# Patient Record
Sex: Female | Born: 1960 | Race: White | Hispanic: No | Marital: Married | Smoking: Former smoker
Health system: Southern US, Community
[De-identification: ages and names within clinical notes are randomized; demographics above are authoritative.]

## PROBLEM LIST (undated history)

## (undated) DIAGNOSIS — F419 Anxiety disorder, unspecified: Secondary | ICD-10-CM

## (undated) DIAGNOSIS — M1990 Unspecified osteoarthritis, unspecified site: Secondary | ICD-10-CM

## (undated) DIAGNOSIS — J3081 Allergic rhinitis due to animal (cat) (dog) hair and dander: Secondary | ICD-10-CM

## (undated) DIAGNOSIS — I739 Peripheral vascular disease, unspecified: Secondary | ICD-10-CM

## (undated) HISTORY — PX: TUBAL LIGATION: SHX77

---

## 1981-05-25 HISTORY — PX: OTHER SURGICAL HISTORY: SHX169

## 1998-06-01 ENCOUNTER — Inpatient Hospital Stay (HOSPITAL_COMMUNITY): Admission: AD | Admit: 1998-06-01 | Discharge: 1998-06-01 | Payer: Self-pay | Admitting: Obstetrics & Gynecology

## 2000-11-11 ENCOUNTER — Other Ambulatory Visit: Admission: RE | Admit: 2000-11-11 | Discharge: 2000-11-11 | Payer: Self-pay | Admitting: Obstetrics & Gynecology

## 2000-11-17 ENCOUNTER — Encounter: Admission: RE | Admit: 2000-11-17 | Discharge: 2000-11-17 | Payer: Self-pay | Admitting: Obstetrics & Gynecology

## 2000-11-17 ENCOUNTER — Encounter: Payer: Self-pay | Admitting: Obstetrics & Gynecology

## 2010-07-12 ENCOUNTER — Inpatient Hospital Stay (INDEPENDENT_AMBULATORY_CARE_PROVIDER_SITE_OTHER)
Admission: RE | Admit: 2010-07-12 | Discharge: 2010-07-12 | Disposition: A | Discharge status: Home / Self Care | Payer: 59 | Source: Ambulatory Visit | Attending: Family Medicine | Admitting: Family Medicine

## 2010-07-12 ENCOUNTER — Emergency Department (HOSPITAL_COMMUNITY)
Admission: EM | Admit: 2010-07-12 | Discharge: 2010-07-12 | Disposition: A | Discharge status: Home / Self Care | Payer: 59 | Attending: Emergency Medicine | Admitting: Emergency Medicine

## 2010-07-12 DIAGNOSIS — R109 Unspecified abdominal pain: Secondary | ICD-10-CM

## 2010-07-12 DIAGNOSIS — K625 Hemorrhage of anus and rectum: Secondary | ICD-10-CM

## 2010-07-12 DIAGNOSIS — N760 Acute vaginitis: Secondary | ICD-10-CM | POA: Insufficient documentation

## 2010-07-12 DIAGNOSIS — B9689 Other specified bacterial agents as the cause of diseases classified elsewhere: Secondary | ICD-10-CM | POA: Insufficient documentation

## 2010-07-12 DIAGNOSIS — N949 Unspecified condition associated with female genital organs and menstrual cycle: Secondary | ICD-10-CM | POA: Insufficient documentation

## 2010-07-12 DIAGNOSIS — R197 Diarrhea, unspecified: Secondary | ICD-10-CM | POA: Insufficient documentation

## 2010-07-12 DIAGNOSIS — A499 Bacterial infection, unspecified: Secondary | ICD-10-CM | POA: Insufficient documentation

## 2010-07-12 LAB — URINALYSIS, ROUTINE W REFLEX MICROSCOPIC
Bilirubin Urine: NEGATIVE
Ketones, ur: NEGATIVE mg/dL
Leukocytes, UA: NEGATIVE
Nitrite: NEGATIVE
Protein, ur: NEGATIVE mg/dL

## 2010-07-12 LAB — DIFFERENTIAL
Basophils Absolute: 0 10*3/uL (ref 0.0–0.1)
Basophils Relative: 0 % (ref 0–1)
Eosinophils Absolute: 0.1 10*3/uL (ref 0.0–0.7)
Eosinophils Relative: 1 % (ref 0–5)
Lymphocytes Relative: 32 % (ref 12–46)
Lymphs Abs: 2.9 10*3/uL (ref 0.7–4.0)
Monocytes Absolute: 0.8 10*3/uL (ref 0.1–1.0)
Monocytes Relative: 9 % (ref 3–12)
Neutro Abs: 5.2 10*3/uL (ref 1.7–7.7)
Neutrophils Relative %: 57 % (ref 43–77)

## 2010-07-12 LAB — CBC
HCT: 39.4 % (ref 36.0–46.0)
Hemoglobin: 13.7 g/dL (ref 12.0–15.0)
MCH: 30.8 pg (ref 26.0–34.0)
MCHC: 34.8 g/dL (ref 30.0–36.0)
MCV: 88.5 fL (ref 78.0–100.0)
Platelets: 218 10*3/uL (ref 150–400)
RBC: 4.45 MIL/uL (ref 3.87–5.11)
RDW: 14.1 % (ref 11.5–15.5)
WBC: 9.1 10*3/uL (ref 4.0–10.5)

## 2010-07-12 LAB — COMPREHENSIVE METABOLIC PANEL
ALT: 25 U/L (ref 0–35)
AST: 24 U/L (ref 0–37)
Albumin: 4.1 g/dL (ref 3.5–5.2)
Alkaline Phosphatase: 67 U/L (ref 39–117)
BUN: 9 mg/dL (ref 6–23)
CO2: 29 mEq/L (ref 19–32)
Calcium: 9.8 mg/dL (ref 8.4–10.5)
Chloride: 105 mEq/L (ref 96–112)
Creatinine, Ser: 0.74 mg/dL (ref 0.4–1.2)
GFR calc Af Amer: >60 mL/min (ref >60)
GFR calc non Af Amer: >60 mL/min (ref >60)
Glucose, Bld: 96 mg/dL (ref 70–99)
Potassium: 4.5 mEq/L (ref 3.5–5.1)
Sodium: 142 mEq/L (ref 135–145)
Total Bilirubin: 0.6 mg/dL (ref 0.3–1.2)
Total Protein: 7.7 g/dL (ref 6.0–8.3)

## 2010-07-12 LAB — WET PREP, GENITAL
Trich, Wet Prep: NONE SEEN
Yeast Wet Prep HPF POC: NONE SEEN

## 2010-07-12 LAB — HEMOCCULT GUIAC POC 1CARD (OFFICE): Fecal Occult Bld: POSITIVE

## 2010-07-12 LAB — OCCULT BLOOD, POC DEVICE: Fecal Occult Bld: POSITIVE

## 2010-07-13 LAB — URINE CULTURE
Colony Count: NO GROWTH
Culture: NO GROWTH

## 2010-07-14 LAB — GC/CHLAMYDIA PROBE AMP, GENITAL: Chlamydia, DNA Probe: NEGATIVE

## 2012-10-06 ENCOUNTER — Encounter (HOSPITAL_COMMUNITY): Payer: Self-pay | Admitting: Obstetrics and Gynecology

## 2012-10-06 ENCOUNTER — Other Ambulatory Visit: Payer: Self-pay | Admitting: Internal Medicine

## 2012-10-06 DIAGNOSIS — R1011 Right upper quadrant pain: Secondary | ICD-10-CM

## 2012-10-07 ENCOUNTER — Other Ambulatory Visit: Payer: Self-pay | Admitting: Obstetrics and Gynecology

## 2012-10-07 ENCOUNTER — Ambulatory Visit
Admission: RE | Admit: 2012-10-07 | Discharge: 2012-10-07 | Disposition: A | Discharge status: Home / Self Care | Payer: 59 | Source: Ambulatory Visit | Attending: Internal Medicine | Admitting: Internal Medicine

## 2012-10-07 DIAGNOSIS — R1011 Right upper quadrant pain: Secondary | ICD-10-CM

## 2012-10-10 ENCOUNTER — Encounter (HOSPITAL_COMMUNITY): Payer: Self-pay | Admitting: Pharmacist

## 2012-10-10 ENCOUNTER — Other Ambulatory Visit: Payer: 59

## 2012-10-10 NOTE — H&P (Signed)
[name] is an 52 y.o. female who presented with vaginal bleeding and underwent an endometrial biopsy which was suggestive of endometrial carcinoma. Now being taking to operating room to undergo D&C and hysteroscopy to confirm diagnosis and assess extent of disease.   Past Medical History  Diagnosis Date  . SVD (spontaneous vaginal delivery) 1988  . Anxiety   . Allergy to cats   . Arthritis     hands    Past Surgical History  Procedure Laterality Date  . Cesarean section  1981  . Tubal ligation      History reviewed. No pertinent family history.  Social History:  reports that she quit smoking about 15 months ago. Her smoking use included Cigarettes. She has a 34 pack-year smoking history. She has never used smokeless tobacco. She reports that  drinks alcohol. She reports that she does not use illicit drugs.  Allergies: No Known Allergies  No prescriptions prior to admission    Review of Systems  Constitutional: Negative.   HENT: Negative.   Respiratory: Negative for cough and hemoptysis.   Cardiovascular: Negative for chest pain and palpitations.  Gastrointestinal: Negative for heartburn, nausea, vomiting, abdominal pain, diarrhea, constipation and blood in stool.  Genitourinary: Negative for dysuria, urgency, frequency and hematuria.    Height 5' 8.5" (1.74 m), weight 83.915 kg (185 lb). Physical Exam  Constitutional: She appears well-developed and well-nourished.  HENT:  Head: Normocephalic and atraumatic.  Eyes: Conjunctivae are normal. Pupils are equal, round, and reactive to light.  Neck: Normal range of motion. Neck supple. No thyromegaly present.  Cardiovascular: Normal rate, regular rhythm and normal heart sounds.   Respiratory: Effort normal and breath sounds normal.  GI: Soft. Bowel sounds are normal. She exhibits no distension and no mass. There is no tenderness. There is no rebound and no guarding.  Gyn Exam:    External genitalia wnl   BUS: within  normal limits   Vagina: without lesion   Uterus: Normal size anterior and non tender   Adnexa: Non tender and without mass    No results found for this or any previous visit (from the past 24 hour(s)).  No results found.  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  Kaelyn Innocent 10/10/2012, 4:20 PM

## 2012-10-11 ENCOUNTER — Ambulatory Visit (HOSPITAL_COMMUNITY): Payer: 59 | Admitting: Anesthesiology

## 2012-10-11 ENCOUNTER — Encounter (HOSPITAL_COMMUNITY): Payer: Self-pay | Admitting: Anesthesiology

## 2012-10-11 ENCOUNTER — Encounter (HOSPITAL_COMMUNITY)
Admission: RE | Disposition: A | Discharge status: Home / Self Care | Payer: Self-pay | Source: Ambulatory Visit | Attending: Obstetrics and Gynecology

## 2012-10-11 ENCOUNTER — Ambulatory Visit (HOSPITAL_COMMUNITY)
Admission: RE | Admit: 2012-10-11 | Discharge: 2012-10-11 | Disposition: A | Discharge status: Home / Self Care | Payer: 59 | Source: Ambulatory Visit | Attending: Obstetrics and Gynecology | Admitting: Obstetrics and Gynecology

## 2012-10-11 DIAGNOSIS — C549 Malignant neoplasm of corpus uteri, unspecified: Secondary | ICD-10-CM | POA: Insufficient documentation

## 2012-10-11 DIAGNOSIS — N950 Postmenopausal bleeding: Secondary | ICD-10-CM | POA: Insufficient documentation

## 2012-10-11 HISTORY — PX: HYSTEROSCOPY WITH D & C: SHX1775

## 2012-10-11 HISTORY — DX: Allergic rhinitis due to animal (cat) (dog) hair and dander: J30.81

## 2012-10-11 HISTORY — DX: Anxiety disorder, unspecified: F41.9

## 2012-10-11 HISTORY — DX: Unspecified osteoarthritis, unspecified site: M19.90

## 2012-10-11 LAB — CBC
MCHC: 32.5 g/dL (ref 30.0–36.0)
MCV: 86.8 fL (ref 78.0–100.0)
Platelets: 205 10*3/uL (ref 150–400)
RDW: 14.3 % (ref 11.5–15.5)
WBC: 6.8 10*3/uL (ref 4.0–10.5)

## 2012-10-11 LAB — BASIC METABOLIC PANEL
BUN: 13 mg/dL (ref 6–23)
Chloride: 103 mEq/L (ref 96–112)
Creatinine, Ser: 0.71 mg/dL (ref 0.50–1.10)
GFR calc Af Amer: >90 mL/min (ref >90)
GFR calc non Af Amer: >90 mL/min (ref >90)
Potassium: 4 mEq/L (ref 3.5–5.1)

## 2012-10-11 SURGERY — DILATATION AND CURETTAGE /HYSTEROSCOPY
Anesthesia: General | Site: Vagina | Wound class: Clean Contaminated

## 2012-10-11 MED ORDER — ONDANSETRON HCL 4 MG/2ML IJ SOLN
INTRAMUSCULAR | Status: AC
  Filled 2012-10-11: qty 2

## 2012-10-11 MED ORDER — LIDOCAINE HCL (CARDIAC) 20 MG/ML IV SOLN
INTRAVENOUS | Status: AC
  Filled 2012-10-11: qty 5

## 2012-10-11 MED ORDER — PROPOFOL 10 MG/ML IV EMUL
INTRAVENOUS | Status: AC
  Filled 2012-10-11: qty 20

## 2012-10-11 MED ORDER — LIDOCAINE HCL 1 % IJ SOLN
INTRAMUSCULAR | Status: DC | PRN
  Administered 2012-10-11: 10 mL

## 2012-10-11 MED ORDER — PROPOFOL 10 MG/ML IV BOLUS
INTRAVENOUS | Status: DC | PRN
  Administered 2012-10-11: 180 ug via INTRAVENOUS

## 2012-10-11 MED ORDER — LACTATED RINGERS IV SOLN
INTRAVENOUS | Status: DC
  Administered 2012-10-11: 08:00:00 via INTRAVENOUS

## 2012-10-11 MED ORDER — LACTATED RINGERS IR SOLN
Status: DC | PRN
  Administered 2012-10-11: 3000 mL

## 2012-10-11 MED ORDER — MIDAZOLAM HCL 5 MG/5ML IJ SOLN
INTRAMUSCULAR | Status: DC | PRN
  Administered 2012-10-11: 2 mg via INTRAVENOUS

## 2012-10-11 MED ORDER — GLYCOPYRROLATE 0.2 MG/ML IJ SOLN
INTRAMUSCULAR | Status: AC
  Filled 2012-10-11: qty 3

## 2012-10-11 MED ORDER — NEOSTIGMINE METHYLSULFATE 1 MG/ML IJ SOLN
INTRAMUSCULAR | Status: AC
  Filled 2012-10-11: qty 1

## 2012-10-11 MED ORDER — FLUMAZENIL 1 MG/10ML IV SOLN
INTRAVENOUS | Status: AC
  Filled 2012-10-11: qty 10

## 2012-10-11 MED ORDER — FLUMAZENIL 0.5 MG/5ML IV SOLN
INTRAVENOUS | Status: DC | PRN
  Administered 2012-10-11: 0.2 mg via INTRAVENOUS

## 2012-10-11 MED ORDER — LIDOCAINE HCL (CARDIAC) 20 MG/ML IV SOLN
INTRAVENOUS | Status: DC | PRN
  Administered 2012-10-11: 40 mg via INTRAVENOUS
  Administered 2012-10-11: 30 mg via INTRAVENOUS

## 2012-10-11 MED ORDER — ROCURONIUM BROMIDE 50 MG/5ML IV SOLN
INTRAVENOUS | Status: AC
  Filled 2012-10-11: qty 1

## 2012-10-11 MED ORDER — KETOROLAC TROMETHAMINE 30 MG/ML IJ SOLN
INTRAMUSCULAR | Status: AC
  Filled 2012-10-11: qty 1

## 2012-10-11 MED ORDER — KETOROLAC TROMETHAMINE 30 MG/ML IJ SOLN
INTRAMUSCULAR | Status: DC | PRN
  Administered 2012-10-11: 30 mg via INTRAVENOUS

## 2012-10-11 MED ORDER — ONDANSETRON HCL 4 MG/2ML IJ SOLN
INTRAMUSCULAR | Status: DC | PRN
  Administered 2012-10-11: 4 mg via INTRAVENOUS

## 2012-10-11 MED ORDER — MIDAZOLAM HCL 2 MG/2ML IJ SOLN
INTRAMUSCULAR | Status: AC
  Filled 2012-10-11: qty 2

## 2012-10-11 MED ORDER — METOCLOPRAMIDE HCL 5 MG/ML IJ SOLN: 10.0000 mg | Freq: Once | INTRAMUSCULAR | Status: DC | PRN

## 2012-10-11 MED ORDER — MEPERIDINE HCL 25 MG/ML IJ SOLN: 6.2500 mg | INTRAMUSCULAR | Status: DC | PRN

## 2012-10-11 MED ORDER — DEXAMETHASONE SODIUM PHOSPHATE 10 MG/ML IJ SOLN
INTRAMUSCULAR | Status: AC
  Filled 2012-10-11: qty 1

## 2012-10-11 MED ORDER — FENTANYL CITRATE 0.05 MG/ML IJ SOLN
INTRAMUSCULAR | Status: DC | PRN
  Administered 2012-10-11: 50 ug via INTRAVENOUS

## 2012-10-11 MED ORDER — FENTANYL CITRATE 0.05 MG/ML IJ SOLN: 25.0000 ug | INTRAMUSCULAR | Status: DC | PRN

## 2012-10-11 MED ORDER — FENTANYL CITRATE 0.05 MG/ML IJ SOLN
INTRAMUSCULAR | Status: AC
  Filled 2012-10-11: qty 2

## 2012-10-11 SURGICAL SUPPLY — 15 items
ABLATOR ENDOMETRIAL BIPOLAR (ABLATOR) IMPLANT
CANISTER SUCTION 2500CC (MISCELLANEOUS) ×2 IMPLANT
CATH ROBINSON RED A/P 16FR (CATHETERS) ×2 IMPLANT
CLOTH BEACON ORANGE TIMEOUT ST (SAFETY) ×2 IMPLANT
CONTAINER PREFILL 10% NBF 60ML (FORM) ×4 IMPLANT
DRESSING TELFA 8X3 (GAUZE/BANDAGES/DRESSINGS) ×2 IMPLANT
ELECT REM PT RETURN 9FT ADLT (ELECTROSURGICAL)
ELECTRODE REM PT RTRN 9FT ADLT (ELECTROSURGICAL) IMPLANT
GLOVE BIO SURGEON STRL SZ7.5 (GLOVE) ×2 IMPLANT
GOWN STRL REIN XL XLG (GOWN DISPOSABLE) ×4 IMPLANT
LOOP ANGLED CUTTING 22FR (CUTTING LOOP) IMPLANT
PACK HYSTEROSCOPY LF (CUSTOM PROCEDURE TRAY) ×2 IMPLANT
PAD OB MATERNITY 4.3X12.25 (PERSONAL CARE ITEMS) ×2 IMPLANT
TOWEL OR 17X24 6PK STRL BLUE (TOWEL DISPOSABLE) ×4 IMPLANT
WATER STERILE IRR 1000ML POUR (IV SOLUTION) ×2 IMPLANT

## 2012-10-11 NOTE — Transfer of Care (Signed)
Immediate Anesthesia Transfer of Care Note  Patient: [name]  Procedure(s) Performed: Procedure(s): DILATATION AND CURETTAGE /HYSTEROSCOPY (N/A)  Patient Location: PACU  Anesthesia Type:General  Level of Consciousness: awake and patient cooperative  Airway & Oxygen Therapy: Patient Spontanous Breathing and Patient connected to nasal cannula oxygen  Post-op Assessment: Report given to PACU RN and Post -op Vital signs reviewed and stable  Post vital signs: Reviewed and stable  Complications: No apparent anesthesia complications

## 2012-10-11 NOTE — Anesthesia Postprocedure Evaluation (Signed)
  Anesthesia Post-op Note  Patient: [name]  Procedure(s) Performed: Procedure(s): DILATATION AND CURETTAGE /HYSTEROSCOPY (N/A)  Patient Location: PACU  Anesthesia Type:General  Level of Consciousness: awake, alert  and oriented  Airway and Oxygen Therapy: Patient Spontanous Breathing  Post-op Pain: mild  Post-op Assessment: Post-op Vital signs reviewed, Patient's Cardiovascular Status Stable, Respiratory Function Stable, Patent Airway, No signs of Nausea or vomiting and Pain level controlled  Post-op Vital Signs: Reviewed and stable  Complications: No apparent anesthesia complications

## 2012-10-11 NOTE — Op Note (Signed)
NAMECARMELLIA, [name] NO.:  [id number]  MEDICAL RECORD NO.:  [id number]  LOCATION:  [facility]                          FACILITY:  [facility]  PHYSICIAN:  Miguel Aschoff, M.D.       DATE OF BIRTH:  05/29/1960  DATE OF PROCEDURE:  10/11/2012 DATE OF DISCHARGE:                              OPERATIVE REPORT   PREOPERATIVE DIAGNOSIS:  Endometrial carcinoma.  POSTOPERATIVE DIAGNOSIS:  Endometrial carcinoma.  PROCEDURES:  Cervical dilatation, hysteroscopy, fractional uterine curettage.  ANESTHESIA:  General.  COMPLICATIONS:  None.  JUSTIFICATION:  The patient is a 52 year old white female who presented to the office reporting the onset of postmenopausal vaginal bleeding. The patient underwent an endometrial Pipelle biopsy in the office with cells coming back suggestive of endometrial carcinoma.  Because of this, she is being brought to the operating room at this time to confirm the diagnosis and evaluate the extent of disease.  The risks and benefits of the procedure were discussed with the patient.  An informed consent has been obtained.  DESCRIPTION OF PROCEDURE:  The patient was taken to the operating room, placed in supine position.  General anesthesia was administered without difficulty.  She was then placed in the dorsal lithotomy position, prepped and draped in usual sterile fashion.  The bladder was then catheterized.  Once this was done, examination under anesthesia revealed normal external genitalia.  Normal Bartholin's, Skene's glands.  Normal urethra.  Vaginal vault had a first to second-degree cystocele.  No other lesions were noted.  The cervix was without gross lesion.  The uterus was noted to be anterior, normal size and shape, mobile.  No adnexal masses were noted.  At this point, speculum was placed in the vaginal vault.  Anterior cervical lip grasped with a tenaculum.  The uterus was then sounded to 8 cm.  Following this, serial Pratt dilators were used to  dilate the cervix until a #23 Pratt dilator could be passed.  Once this was done, the diagnostic hysteroscope was advanced through the endocervical canal.  No endocervical lesions were noted. Inspection of the endometrial cavity revealed to be mostly atrophic, however, in the left upper anterior portion of the fundus, there was a small lesion noted extending into the endometrial cavity and this was thought to represent the tumor.  This was photographed.  Once this was done, the hysteroscope was removed.  Endocervical curettage was carried out.  This was sent as a separate specimen, then vigorous sharp curettage was carried out of the endometrial cavity.  The small amount of tissue obtained was sent as a separate specimen for histologic study. At this point, the cervix was injected with total of 10 mL of 1% Xylocaine for postop analgesia.  All instruments were removed.  The patient was reversed from the anesthetic and brought to the recovery room in satisfactory condition.  The estimated blood loss from the procedure was less than 10 mL.  The patient tolerated the procedure well.  Plan is for the patient to call on Thursday, the 22nd, for pathology report.  She is to call for any problems such as fever, pain, or heavy bleeding.  Medications for home include tramadol 50 mg t.i.d. p.r.n.  pain.     Miguel Aschoff, M.D.     AR/MEDQ  D:  10/11/2012  T:  10/11/2012  Job:  [id number]

## 2012-10-11 NOTE — H&P (Signed)
  Status unchanged will proceed with planned procedure. 

## 2012-10-11 NOTE — Anesthesia Preprocedure Evaluation (Addendum)
Anesthesia Evaluation  Patient identified by MRN, date of birth, ID band Patient awake    Reviewed: Allergy & Precautions, H&P , NPO status , Patient's Chart, lab work & pertinent test results  Airway Mallampati: I TM Distance: >3 FB Neck ROM: Full    Dental no notable dental hx. (+) Teeth Intact and Partial Lower   Pulmonary former smoker,  breath sounds clear to auscultation  Pulmonary exam normal       Cardiovascular negative cardio ROS  Rhythm:Regular Rate:Normal     Neuro/Psych Anxiety    GI/Hepatic negative GI ROS, Neg liver ROS,   Endo/Other  negative endocrine ROS  Renal/GU negative Renal ROS  negative genitourinary   Musculoskeletal negative musculoskeletal ROS (+)   Abdominal   Peds  Hematology negative hematology ROS (+)   Anesthesia Other Findings   Reproductive/Obstetrics AUB                          Anesthesia Physical Anesthesia Plan  ASA: II  Anesthesia Plan: General   Post-op Pain Management:    Induction: Intravenous  Airway Management Planned: LMA  Additional Equipment:   Intra-op Plan:   Post-operative Plan: Extubation in OR  Informed Consent: I have reviewed the patients History and Physical, chart, labs and discussed the procedure including the risks, benefits and alternatives for the proposed anesthesia with the patient or authorized representative who has indicated his/her understanding and acceptance.   Dental advisory given  Plan Discussed with: CRNA, Anesthesiologist and Surgeon  Anesthesia Plan Comments:         Anesthesia Quick Evaluation

## 2012-10-11 NOTE — Brief Op Note (Signed)
10/11/2012  9:19 AM  PATIENT:  [name]  52 y.o. female  PRE-OPERATIVE DIAGNOSIS:  abnormal bleeding. possible endometrial cancer [id number]  POST-OPERATIVE DIAGNOSIS:  abnormal bleeding. possible endometrial cancer  PROCEDURE:  Procedure(s): DILATATION AND CURETTAGE /HYSTEROSCOPY (N/A)  SURGEON:  Surgeon(s) and Role:    * Miguel Aschoff, MD - Primary   ANESTHESIA:   general  EBL:  Total I/O In: -  Out: 175 [Urine:150; Blood:25]  BLOOD ADMINISTERED:none  DRAINS: none   LOCAL MEDICATIONS USED:  LIDOCAINE   SPECIMEN:  Source of Specimen:  1. Endocervical currettings, 2. Endometrial currettings  DISPOSITION OF SPECIMEN:  PATHOLOGY  COUNTS:  YES  TOURNIQUET:  * No tourniquets in log *  DICTATION: .Other Dictation: Dictation Number [id number]  PLAN OF CARE: Discharge to home after PACU  PATIENT DISPOSITION:  PACU - hemodynamically stable.   Delay start of Pharmacological VTE agent (>24hrs) due to surgical blood loss or risk of bleeding: not applicable

## 2012-10-12 ENCOUNTER — Encounter (HOSPITAL_COMMUNITY): Payer: Self-pay | Admitting: Obstetrics and Gynecology

## 2012-10-25 ENCOUNTER — Ambulatory Visit: Payer: 59 | Admitting: Gynecologic Oncology

## 2012-10-28 ENCOUNTER — Ambulatory Visit: Payer: 59 | Attending: Gynecology | Admitting: Gynecology

## 2012-10-28 ENCOUNTER — Encounter: Payer: Self-pay | Admitting: Gynecology

## 2012-10-28 VITALS — BP 156/88 | HR 80 | Temp 98.9°F | Resp 18 | Ht 68.5 in | Wt 186.1 lb

## 2012-10-28 DIAGNOSIS — N8501 Benign endometrial hyperplasia: Secondary | ICD-10-CM

## 2012-10-28 DIAGNOSIS — R141 Gas pain: Secondary | ICD-10-CM | POA: Insufficient documentation

## 2012-10-28 DIAGNOSIS — R142 Eructation: Secondary | ICD-10-CM | POA: Insufficient documentation

## 2012-10-28 DIAGNOSIS — N8502 Endometrial intraepithelial neoplasia [EIN]: Secondary | ICD-10-CM | POA: Insufficient documentation

## 2012-10-28 DIAGNOSIS — Z87891 Personal history of nicotine dependence: Secondary | ICD-10-CM | POA: Insufficient documentation

## 2012-10-28 NOTE — Progress Notes (Signed)
Consult Note: Gyn-Onc   [name] 52 y.o. female  Chief Complaint  Patient presents with  . Complex Hyperplasia    New patient    Assessment : Complex endometrial hyperplasia with focal atypia.  Plan: Management options were discussed with the patient and her husband. They're informed that the standard of care would be to perform a hysterectomy and bilateral salpingo-oophorectomy. Alternatives management using progestins was also discussed. The surgery and postop course were also discussed. I indicated that depending upon the surgeon's preference the procedure to be performed the laparoscope or possibly vaginally. I feel this can be managed quite well by Dr. Aldona Bar or his partners. They'll contact Dr. Willia Craze office next week to schedule surgery. The risks of surgery discussed as well as the expected postoperative recovery.     HPI: 52 year old white married female seen in consultation at the request of Dr. Annamaria Helling regarding management of a newly diagnosed complex hyperplasia with focal atypia of the endometrium. Patient initially presented with postmenopausal bleeding and initial biopsy was suspicious for an endometrial carcinoma. Therefore, a formal fractional D&C was performed on 10/11/2012. The patient's had an uncomplicated postoperative course.  The patient is currently not taking any hormone replacement therapy and has really only taken chest fusion and Provera for approximately a month and her life. She is 3 years postmenopausal.  She has a past history of having ovarian cyst and she was 52 years of age. She has had a prior cesarean section and a laparoscopic tubal cautery. She has no other gynecologic history.  Patient does complain of some bloating and gas. It is noted that she has gained approximately 40 pounds after stopping smoking and is working actively to try to lose weight.  Review of Systems:10 point review of systems is negative except as noted in interval history.    Vitals: Blood pressure 156/88, pulse 80, temperature 98.9 F (37.2 C), resp. rate 18, height 5' 8.5" (1.74 m), weight 186 lb 1.6 oz (84.414 kg).  Physical Exam: General : The patient is a healthy woman in no acute distress.  HEENT: normocephalic, extraoccular movements normal; neck is supple without thyromegally  Lynphnodes: Supraclavicular and inguinal nodes not enlarged  Abdomen: Soft, non-tender, no ascites, no organomegally, no masses, no hernias well-healed laparoscopic and Pfannenstiel incisions.  Pelvic:  EGBUS: Normal female  Vagina: Normal, no lesions  Urethra and Bladder: Normal, non-tender  Cervix: Normal  Uterus: Anterior normal shape size and consistency Bi-manual examination: Non-tender; no adenxal masses or nodularity  Rectal: normal sphincter tone, no masses, no blood  Lower extremities: No edema or varicosities. Normal range of motion      No Known Allergies  Past Medical History  Diagnosis Date  . SVD (spontaneous vaginal delivery) 1988  . Anxiety   . Allergy to cats   . Arthritis     hands    Past Surgical History  Procedure Laterality Date  . Cesarean section  1981  . Tubal ligation    . Nose repair Bilateral 1983  . Hysteroscopy w/d&c N/A 10/11/2012    Procedure: DILATATION AND CURETTAGE /HYSTEROSCOPY;  Surgeon: Miguel Aschoff, MD;  Location: [facility];  Service: Gynecology;  Laterality: N/A;    Current Outpatient Prescriptions  Medication Sig Dispense Refill  . ALPRAZolam (XANAX) 0.5 MG tablet Take 0.5 mg by mouth at bedtime.      . Ascorbic Acid (VITAMIN C) 1000 MG tablet Take 1,000 mg by mouth daily.      . Calcium Carbonate-Vitamin D (CALCIUM +  D PO) Take 1 tablet by mouth daily.      . Multiple Vitamin (MULTIVITAMIN WITH MINERALS) TABS Take 1 tablet by mouth daily.      . Simethicone (GAS-X PO) Take 1 capsule by mouth daily.      . vitamin E 400 UNIT capsule Take 400 Units by mouth daily.      Marland Kitchen zolpidem (AMBIEN) 5 MG tablet Take 2.5 mg by mouth  at bedtime as needed for sleep.      Marland Kitchen ibuprofen (ADVIL,MOTRIN) 200 MG tablet Take 400 mg by mouth every 6 (six) hours as needed for pain.       No current facility-administered medications for this visit.    History   Social History  . Marital Status: Married    Spouse Name: N/A    Number of Children: N/A  . Years of Education: N/A   Occupational History  . Not on file.   Social History Main Topics  . Smoking status: Former Smoker -- 1.00 packs/day for 34 years    Types: Cigarettes    Quit date: 07/04/2011  . Smokeless tobacco: Never Used  . Alcohol Use: Yes     Comment: socially  . Drug Use: No  . Sexually Active: Yes    Birth Control/ Protection: Surgical   Other Topics Concern  . Not on file   Social History Narrative  . No narrative on file    Family History  Problem Relation Age of Onset  . Cancer Mother     Blood cancer  . Leukemia Father   . Cancer Paternal Grandmother       Jeannette Corpus, MD 10/28/2012, 1:36 PM

## 2012-10-28 NOTE — Patient Instructions (Signed)
Please contact Dr. Willia Craze office to schedule surgery.

## 2012-11-11 ENCOUNTER — Other Ambulatory Visit: Payer: Self-pay | Admitting: Obstetrics and Gynecology

## 2012-11-13 ENCOUNTER — Encounter (HOSPITAL_COMMUNITY): Payer: Self-pay | Admitting: Pharmacy Technician

## 2012-11-21 NOTE — Pre-Procedure Instructions (Signed)
Message left for [name] to have MD place preoperative orders.

## 2012-11-22 ENCOUNTER — Encounter (HOSPITAL_COMMUNITY): Payer: Self-pay

## 2012-11-22 ENCOUNTER — Encounter (HOSPITAL_COMMUNITY)
Admission: RE | Admit: 2012-11-22 | Discharge: 2012-11-22 | Disposition: A | Discharge status: Home / Self Care | Payer: 59 | Source: Ambulatory Visit | Attending: Obstetrics and Gynecology | Admitting: Obstetrics and Gynecology

## 2012-11-22 HISTORY — DX: Peripheral vascular disease, unspecified: I73.9

## 2012-11-22 LAB — BASIC METABOLIC PANEL
BUN: 15 mg/dL (ref 6–23)
Calcium: 9.8 mg/dL (ref 8.4–10.5)
Creatinine, Ser: 0.69 mg/dL (ref 0.50–1.10)
GFR calc non Af Amer: >90 mL/min (ref >90)
Glucose, Bld: 109 mg/dL — ABNORMAL HIGH (ref 70–99)
Potassium: 4.4 mEq/L (ref 3.5–5.1)

## 2012-11-22 LAB — CBC
Hemoglobin: 12.4 g/dL (ref 12.0–15.0)
MCH: 28.8 pg (ref 26.0–34.0)
MCHC: 33.2 g/dL (ref 30.0–36.0)
RDW: 14.2 % (ref 11.5–15.5)

## 2012-11-22 NOTE — Patient Instructions (Addendum)
   Your procedure is scheduled on:  Thursday, July 3  Enter through the [employer] of [facility] at:  10 am Pick up the phone at the desk and dial [phone] and inform us of your arrival.  Please call this number if you have any problems the morning of surgery: [phone]  Remember: Do not eat or drink after midnight:  Wednesday Take these medicines the morning of surgery with a SIP OF WATER:  Xanax   Do not wear jewelry, make-up, or FINGER nail polish No metal in your hair or on your body. Do not wear lotions, powders, perfumes. You may wear deodorant.  Please use your CHG wash as directed prior to surgery.  Do not shave anywhere for at least 12 hours prior to first CHG shower.  Do not bring valuables to the hospital. Contacts, dentures or bridgework may not be worn into surgery.  Leave suitcase in the car. After Surgery it may be brought to your room. For patients being admitted to the hospital, checkout time is 11:00am the day of discharge.  Home with husband Fayrene Fearing.

## 2012-11-23 ENCOUNTER — Other Ambulatory Visit: Payer: Self-pay | Admitting: Obstetrics and Gynecology

## 2012-11-23 NOTE — Addendum Note (Signed)
Addended by: Miguel Aschoff on: 11/23/2012 08:34 PM   Modules accepted: Orders

## 2012-11-23 NOTE — H&P (Signed)
[name] is an 52 y.o. female who developed post menopausal bleeding and had pipelle biopsy suggestive of endometrial carcinoma. She then had a full D&C and hysteroscopy which showed atypical hyperplasia but no carcinoma. Gyn Oncology referral was made and they suggested only hysterectomy and BSO but no need for radical procedure or node dissection. She is now being admitted to under LAVH BSO possible TAH BSO.     Menstrual History:  No LMP recorded. Patient is postmenopausal.    Past Medical History  Diagnosis Date  . SVD (spontaneous vaginal delivery) 1988  . Anxiety   . Allergy to cats   . Arthritis     hands  . Peripheral vascular disease     blood clot in back of leg at age 44yr     Past Surgical History  Procedure Laterality Date  . Cesarean section  1981  . Tubal ligation    . Nose repair Bilateral 1983  . Hysteroscopy w/d&c N/A 10/11/2012    Procedure: DILATATION AND CURETTAGE /HYSTEROSCOPY;  Surgeon: Miguel Aschoff, MD;  Location: [facility];  Service: Gynecology;  Laterality: N/A;    Family History  Problem Relation Age of Onset  . Cancer Mother     Blood cancer  . Leukemia Father   . Cancer Paternal Grandmother     Social History:  reports that she quit smoking about 16 months ago. Her smoking use included Cigarettes. She has a 34 pack-year smoking history. She has never used smokeless tobacco. She reports that  drinks alcohol. She reports that she does not use illicit drugs.  Allergies: No Known Allergies  No prescriptions prior to admission    ROS  Respiratory: no cough or SOB GI: no nausea, vomiting, constipatoin or diarrhea GU: no dysuria, frequency or urgency Gyn: see HPI   There were no vitals taken for this visit. Physical Exam  BP 139/92   Pulse 83  Respirations 18  Afebrile  Well developed well nourished white female in no acute distress Head: Normocephalic and atraumatic Eyes: PERRLA Neck; No enlargement of thyroid. No JVD Heart: regular  rhythm no murmur of gallop Abdomen: soft without enlargement of the liver kidney of spleen Pelvic exam:    External genitalia: within normal limits   BUS: with normal limits   Vagina: no lesions seen, normal discharge   Cervix: non tender without lesion   Uterus: anterior normal size with plus one to plus two descensus   Adnexa: no mass found.     No results found for this or any previous visit (from the past 24 hour(s)).  No results found.  Impression: Atypical adenomatous hyperplasia  Plan: LAVH BSO possible TAH BSO.  The risks and benefits of this procedure were discussed with the patient including risk of infection. Hemorrhage, blood clot formation and injury to an adjacent structure and informed consent has been obtained  [name] 11/23/2012, 9:25 PM

## 2012-11-24 ENCOUNTER — Ambulatory Visit (HOSPITAL_COMMUNITY): Payer: 59 | Admitting: Anesthesiology

## 2012-11-24 ENCOUNTER — Observation Stay (HOSPITAL_COMMUNITY)
Admission: RE | Admit: 2012-11-24 | Discharge: 2012-11-26 | Disposition: A | Discharge status: Home / Self Care | Payer: 59 | Source: Ambulatory Visit | Attending: Obstetrics and Gynecology | Admitting: Obstetrics and Gynecology

## 2012-11-24 ENCOUNTER — Encounter (HOSPITAL_COMMUNITY): Payer: Self-pay | Admitting: Anesthesiology

## 2012-11-24 ENCOUNTER — Encounter (HOSPITAL_COMMUNITY)
Admission: RE | Disposition: A | Discharge status: Home / Self Care | Payer: Self-pay | Source: Ambulatory Visit | Attending: Obstetrics and Gynecology

## 2012-11-24 DIAGNOSIS — N840 Polyp of corpus uteri: Secondary | ICD-10-CM | POA: Insufficient documentation

## 2012-11-24 DIAGNOSIS — N950 Postmenopausal bleeding: Principal | ICD-10-CM | POA: Insufficient documentation

## 2012-11-24 DIAGNOSIS — N8502 Endometrial intraepithelial neoplasia [EIN]: Secondary | ICD-10-CM | POA: Insufficient documentation

## 2012-11-24 HISTORY — PX: SALPINGOOPHORECTOMY: SHX82

## 2012-11-24 HISTORY — PX: LAPAROSCOPIC ASSISTED VAGINAL HYSTERECTOMY: SHX5398

## 2012-11-24 LAB — PROTIME-INR: Prothrombin Time: 12.6 seconds (ref 11.6–15.2)

## 2012-11-24 LAB — APTT: aPTT: 30 seconds (ref 24–37)

## 2012-11-24 LAB — HEMOGLOBIN: Hemoglobin: 11.6 g/dL — ABNORMAL LOW (ref 12.0–15.0)

## 2012-11-24 SURGERY — HYSTERECTOMY, VAGINAL, LAPAROSCOPY-ASSISTED
Anesthesia: General | Site: Abdomen | Wound class: Clean Contaminated

## 2012-11-24 MED ORDER — EPHEDRINE SULFATE 50 MG/ML IJ SOLN
INTRAMUSCULAR | Status: DC | PRN
  Administered 2012-11-24 (×2): 10 mg via INTRAVENOUS

## 2012-11-24 MED ORDER — SODIUM CHLORIDE 0.9 % IJ SOLN: 9.0000 mL | INTRAMUSCULAR | Status: DC | PRN

## 2012-11-24 MED ORDER — ROCURONIUM BROMIDE 50 MG/5ML IV SOLN
INTRAVENOUS | Status: AC
  Filled 2012-11-24: qty 1

## 2012-11-24 MED ORDER — LIDOCAINE HCL (CARDIAC) 20 MG/ML IV SOLN
INTRAVENOUS | Status: AC
  Filled 2012-11-24: qty 5

## 2012-11-24 MED ORDER — ROCURONIUM BROMIDE 100 MG/10ML IV SOLN
INTRAVENOUS | Status: DC | PRN
  Administered 2012-11-24: 5 mg via INTRAVENOUS
  Administered 2012-11-24: 45 mg via INTRAVENOUS

## 2012-11-24 MED ORDER — FENTANYL CITRATE 0.05 MG/ML IJ SOLN
INTRAMUSCULAR | Status: DC | PRN
  Administered 2012-11-24 (×3): 50 ug via INTRAVENOUS
  Administered 2012-11-24: 100 ug via INTRAVENOUS
  Administered 2012-11-24: 50 ug via INTRAVENOUS

## 2012-11-24 MED ORDER — DEXAMETHASONE SODIUM PHOSPHATE 4 MG/ML IJ SOLN: INTRAMUSCULAR | Status: DC | PRN

## 2012-11-24 MED ORDER — GLYCOPYRROLATE 0.2 MG/ML IJ SOLN
INTRAMUSCULAR | Status: AC
  Filled 2012-11-24: qty 1

## 2012-11-24 MED ORDER — CEFAZOLIN SODIUM 1-5 GM-% IV SOLN
1.0000 g | Freq: Three times a day (TID) | INTRAVENOUS | Status: AC
  Administered 2012-11-24 – 2012-11-25 (×3): 1 g via INTRAVENOUS
  Filled 2012-11-24 (×3): qty 50

## 2012-11-24 MED ORDER — NEOSTIGMINE METHYLSULFATE 1 MG/ML IJ SOLN
INTRAMUSCULAR | Status: AC
  Filled 2012-11-24: qty 1

## 2012-11-24 MED ORDER — NALOXONE HCL 0.4 MG/ML IJ SOLN: 0.4000 mg | INTRAMUSCULAR | Status: DC | PRN

## 2012-11-24 MED ORDER — MORPHINE SULFATE (PF) 1 MG/ML IV SOLN
INTRAVENOUS | Status: DC
  Administered 2012-11-24: 9 mg via INTRAVENOUS
  Administered 2012-11-24 (×2): via INTRAVENOUS
  Administered 2012-11-24: 9 mg via INTRAVENOUS
  Administered 2012-11-25: 3 mg via INTRAVENOUS
  Administered 2012-11-25: 9.55 mg via INTRAVENOUS
  Administered 2012-11-25: 15 mg via INTRAVENOUS
  Filled 2012-11-24 (×2): qty 25

## 2012-11-24 MED ORDER — PHENYLEPHRINE HCL 10 MG/ML IJ SOLN
INTRAMUSCULAR | Status: DC | PRN
  Administered 2012-11-24 (×3): 40 ug via INTRAVENOUS

## 2012-11-24 MED ORDER — MIDAZOLAM HCL 2 MG/2ML IJ SOLN
INTRAMUSCULAR | Status: AC
  Filled 2012-11-24: qty 2

## 2012-11-24 MED ORDER — ONDANSETRON HCL 4 MG/2ML IJ SOLN
4.0000 mg | Freq: Four times a day (QID) | INTRAMUSCULAR | Status: DC | PRN
  Administered 2012-11-25: 4 mg via INTRAVENOUS
  Filled 2012-11-24: qty 2

## 2012-11-24 MED ORDER — PROPOFOL 10 MG/ML IV BOLUS
INTRAVENOUS | Status: DC | PRN
  Administered 2012-11-24: 150 mg via INTRAVENOUS

## 2012-11-24 MED ORDER — BUPIVACAINE HCL (PF) 0.25 % IJ SOLN
INTRAMUSCULAR | Status: DC | PRN
  Administered 2012-11-24: 10 mL

## 2012-11-24 MED ORDER — PROPOFOL 10 MG/ML IV EMUL
INTRAVENOUS | Status: AC
  Filled 2012-11-24: qty 20

## 2012-11-24 MED ORDER — ACETAMINOPHEN 160 MG/5ML PO SOLN: 975.0000 mg | Freq: Once | ORAL | Status: AC

## 2012-11-24 MED ORDER — ONDANSETRON HCL 4 MG/2ML IJ SOLN
INTRAMUSCULAR | Status: DC | PRN
  Administered 2012-11-24: 4 mg via INTRAVENOUS

## 2012-11-24 MED ORDER — KETOROLAC TROMETHAMINE 30 MG/ML IJ SOLN: 15.0000 mg | Freq: Once | INTRAMUSCULAR | Status: DC | PRN

## 2012-11-24 MED ORDER — IBUPROFEN 600 MG PO TABS: 600.0000 mg | ORAL_TABLET | Freq: Four times a day (QID) | ORAL | Status: DC | PRN

## 2012-11-24 MED ORDER — LACTATED RINGERS IV SOLN
INTRAVENOUS | Status: DC
  Administered 2012-11-24 – 2012-11-25 (×2): via INTRAVENOUS

## 2012-11-24 MED ORDER — LIDOCAINE HCL (CARDIAC) 20 MG/ML IV SOLN
INTRAVENOUS | Status: DC | PRN
  Administered 2012-11-24: 80 mg via INTRAVENOUS

## 2012-11-24 MED ORDER — INDIGOTINDISULFONATE SODIUM 8 MG/ML IJ SOLN
INTRAMUSCULAR | Status: AC
  Filled 2012-11-24: qty 5

## 2012-11-24 MED ORDER — PNEUMOCOCCAL VAC POLYVALENT 25 MCG/0.5ML IJ INJ
0.5000 mL | INJECTION | INTRAMUSCULAR | Status: DC
  Filled 2012-11-24: qty 0.5

## 2012-11-24 MED ORDER — MENTHOL 3 MG MT LOZG: 1.0000 | LOZENGE | OROMUCOSAL | Status: DC | PRN

## 2012-11-24 MED ORDER — HYDROMORPHONE HCL PF 1 MG/ML IJ SOLN
0.2500 mg | INTRAMUSCULAR | Status: DC | PRN
  Administered 2012-11-24 (×2): 0.5 mg via INTRAVENOUS

## 2012-11-24 MED ORDER — CEFAZOLIN SODIUM-DEXTROSE 2-3 GM-% IV SOLR
INTRAVENOUS | Status: AC
  Filled 2012-11-24: qty 50

## 2012-11-24 MED ORDER — EPHEDRINE 5 MG/ML INJ
INTRAVENOUS | Status: AC
  Filled 2012-11-24: qty 10

## 2012-11-24 MED ORDER — ONDANSETRON HCL 4 MG/2ML IJ SOLN
INTRAMUSCULAR | Status: AC
  Filled 2012-11-24: qty 2

## 2012-11-24 MED ORDER — LACTATED RINGERS IV SOLN
INTRAVENOUS | Status: DC
  Administered 2012-11-24 (×4): via INTRAVENOUS

## 2012-11-24 MED ORDER — DIPHENHYDRAMINE HCL 12.5 MG/5ML PO ELIX: 12.5000 mg | ORAL_SOLUTION | Freq: Four times a day (QID) | ORAL | Status: DC | PRN

## 2012-11-24 MED ORDER — ACETAMINOPHEN 160 MG/5ML PO SOLN
ORAL | Status: AC
  Administered 2012-11-24: 975 mg via ORAL
  Filled 2012-11-24: qty 20.3

## 2012-11-24 MED ORDER — GLYCOPYRROLATE 0.2 MG/ML IJ SOLN
INTRAMUSCULAR | Status: AC
  Filled 2012-11-24: qty 3

## 2012-11-24 MED ORDER — BUPIVACAINE HCL (PF) 0.25 % IJ SOLN
INTRAMUSCULAR | Status: AC
  Filled 2012-11-24: qty 30

## 2012-11-24 MED ORDER — NEOSTIGMINE METHYLSULFATE 1 MG/ML IJ SOLN
INTRAMUSCULAR | Status: DC | PRN
  Administered 2012-11-24: 3 mg via INTRAVENOUS

## 2012-11-24 MED ORDER — HYDROMORPHONE HCL PF 1 MG/ML IJ SOLN
INTRAMUSCULAR | Status: AC
  Filled 2012-11-24: qty 1

## 2012-11-24 MED ORDER — ACETAMINOPHEN 10 MG/ML IV SOLN: 1000.0000 mg | Freq: Once | INTRAVENOUS | Status: DC | PRN

## 2012-11-24 MED ORDER — LIDOCAINE-EPINEPHRINE 1 %-1:100000 IJ SOLN
INTRAMUSCULAR | Status: DC | PRN
  Administered 2012-11-24: 7 mL

## 2012-11-24 MED ORDER — DIPHENHYDRAMINE HCL 50 MG/ML IJ SOLN: 12.5000 mg | Freq: Four times a day (QID) | INTRAMUSCULAR | Status: DC | PRN

## 2012-11-24 MED ORDER — DEXAMETHASONE SODIUM PHOSPHATE 10 MG/ML IJ SOLN
INTRAMUSCULAR | Status: DC | PRN
  Administered 2012-11-24: 10 mg via INTRAVENOUS

## 2012-11-24 MED ORDER — FENTANYL CITRATE 0.05 MG/ML IJ SOLN
INTRAMUSCULAR | Status: AC
  Filled 2012-11-24: qty 5

## 2012-11-24 MED ORDER — CEFAZOLIN SODIUM-DEXTROSE 2-3 GM-% IV SOLR
2.0000 g | INTRAVENOUS | Status: AC
  Administered 2012-11-24: 2 g via INTRAVENOUS

## 2012-11-24 MED ORDER — GLYCOPYRROLATE 0.2 MG/ML IJ SOLN
INTRAMUSCULAR | Status: DC | PRN
  Administered 2012-11-24: 0.6 mg via INTRAVENOUS
  Administered 2012-11-24 (×2): 0.1 mg via INTRAVENOUS

## 2012-11-24 MED ORDER — MIDAZOLAM HCL 5 MG/5ML IJ SOLN
INTRAMUSCULAR | Status: DC | PRN
  Administered 2012-11-24: 2 mg via INTRAVENOUS

## 2012-11-24 MED ORDER — KETOROLAC TROMETHAMINE 30 MG/ML IJ SOLN
INTRAMUSCULAR | Status: AC
  Filled 2012-11-24: qty 1

## 2012-11-24 MED ORDER — FENTANYL CITRATE 0.05 MG/ML IJ SOLN
INTRAMUSCULAR | Status: AC
  Filled 2012-11-24: qty 2

## 2012-11-24 MED ORDER — DEXAMETHASONE SODIUM PHOSPHATE 10 MG/ML IJ SOLN
INTRAMUSCULAR | Status: AC
  Filled 2012-11-24: qty 1

## 2012-11-24 MED ORDER — INDIGOTINDISULFONATE SODIUM 8 MG/ML IJ SOLN
INTRAMUSCULAR | Status: DC | PRN
  Administered 2012-11-24: 5 mL via INTRAVENOUS

## 2012-11-24 MED ORDER — OXYCODONE-ACETAMINOPHEN 5-325 MG PO TABS
1.0000 | ORAL_TABLET | ORAL | Status: DC | PRN
  Administered 2012-11-25 – 2012-11-26 (×3): 1 via ORAL
  Filled 2012-11-24 (×3): qty 1

## 2012-11-24 SURGICAL SUPPLY — 52 items
BENZOIN TINCTURE PRP APPL 2/3 (GAUZE/BANDAGES/DRESSINGS) IMPLANT
BLADE SURG 15 STRL LF C SS BP (BLADE) ×3 IMPLANT
BLADE SURG 15 STRL SS (BLADE) ×1
CANISTER SUCTION 2500CC (MISCELLANEOUS) ×4 IMPLANT
CLOTH BEACON ORANGE TIMEOUT ST (SAFETY) ×4 IMPLANT
CONT PATH 16OZ SNAP LID 3702 (MISCELLANEOUS) ×4 IMPLANT
COVER TABLE BACK 60X90 (DRAPES) ×4 IMPLANT
DECANTER SPIKE VIAL GLASS SM (MISCELLANEOUS) IMPLANT
DERMABOND ADVANCED (GAUZE/BANDAGES/DRESSINGS) ×2
DERMABOND ADVANCED .7 DNX12 (GAUZE/BANDAGES/DRESSINGS) ×6 IMPLANT
ELECT REM PT RETURN 9FT ADLT (ELECTROSURGICAL) ×4
ELECTRODE REM PT RTRN 9FT ADLT (ELECTROSURGICAL) ×3 IMPLANT
FILTER SMOKE EVAC LAPAROSHD (FILTER) ×4 IMPLANT
FORCEPS CUTTING 33CM 5MM (CUTTING FORCEPS) ×4 IMPLANT
GAUZE PACKING IODOFORM 2 (PACKING) IMPLANT
GLOVE BIO SURGEON STRL SZ7.5 (GLOVE) ×8 IMPLANT
GLOVE BIOGEL PI IND STRL 6.5 (GLOVE) ×3 IMPLANT
GLOVE BIOGEL PI IND STRL 7.5 (GLOVE) ×3 IMPLANT
GLOVE BIOGEL PI INDICATOR 6.5 (GLOVE) ×1
GLOVE BIOGEL PI INDICATOR 7.5 (GLOVE) ×1
GOWN PREVENTION PLUS LG XLONG (DISPOSABLE) ×8 IMPLANT
GOWN PREVENTION PLUS XXLARGE (GOWN DISPOSABLE) ×4 IMPLANT
GOWN STRL REIN XL XLG (GOWN DISPOSABLE) ×16 IMPLANT
NS IRRIG 1000ML POUR BTL (IV SOLUTION) ×4 IMPLANT
PACK ABDOMINAL GYN (CUSTOM PROCEDURE TRAY) ×4 IMPLANT
PACK LAVH (CUSTOM PROCEDURE TRAY) ×4 IMPLANT
PAD OB MATERNITY 4.3X12.25 (PERSONAL CARE ITEMS) ×4 IMPLANT
PROTECTOR NERVE ULNAR (MISCELLANEOUS) ×4 IMPLANT
SET IRRIG TUBING LAPAROSCOPIC (IRRIGATION / IRRIGATOR) IMPLANT
SOLUTION ELECTROLUBE (MISCELLANEOUS) ×4 IMPLANT
SPONGE LAP 18X18 X RAY DECT (DISPOSABLE) ×8 IMPLANT
STAPLER VISISTAT 35W (STAPLE) IMPLANT
STRIP CLOSURE SKIN 1/2X4 (GAUZE/BANDAGES/DRESSINGS) IMPLANT
STRIP CLOSURE SKIN 1/4X3 (GAUZE/BANDAGES/DRESSINGS) ×4 IMPLANT
SUT CHROMIC 0 CT 1 (SUTURE) IMPLANT
SUT PLAIN 2 0 XLH (SUTURE) IMPLANT
SUT VIC AB 0 CT1 18XCR BRD8 (SUTURE) ×6 IMPLANT
SUT VIC AB 0 CT1 27 (SUTURE) ×4
SUT VIC AB 0 CT1 27XBRD ANBCTR (SUTURE) ×12 IMPLANT
SUT VIC AB 0 CT1 8-18 (SUTURE) ×2
SUT VIC AB 2-0 CT1 27 (SUTURE)
SUT VIC AB 2-0 CT1 TAPERPNT 27 (SUTURE) IMPLANT
SUT VIC AB 2-0 SH 27 (SUTURE)
SUT VIC AB 2-0 SH 27XBRD (SUTURE) IMPLANT
SUT VIC AB 3-0 X1 27 (SUTURE) ×4 IMPLANT
SUT VIC AB 4-0 PS2 27 (SUTURE) IMPLANT
SUT VICRYL 0 TIES 12 18 (SUTURE) ×4 IMPLANT
SUT VICRYL 1 TIES 12X18 (SUTURE) ×4 IMPLANT
TOWEL OR 17X24 6PK STRL BLUE (TOWEL DISPOSABLE) ×12 IMPLANT
TRAY FOLEY CATH 14FR (SET/KITS/TRAYS/PACK) ×4 IMPLANT
WARMER LAPAROSCOPE (MISCELLANEOUS) ×4 IMPLANT
WATER STERILE IRR 1000ML POUR (IV SOLUTION) IMPLANT

## 2012-11-24 NOTE — Anesthesia Preprocedure Evaluation (Addendum)
Anesthesia Evaluation  Patient identified by MRN, date of birth, ID band Patient awake    Reviewed: Allergy & Precautions, H&P , NPO status , Patient's Chart, lab work & pertinent test results  History of Anesthesia Complications Negative for: history of anesthetic complications  Airway Mallampati: I TM Distance: >3 FB Neck ROM: Full    Dental  (+) Teeth Intact Permanent lower bridge:   Pulmonary former smoker,  breath sounds clear to auscultation  Pulmonary exam normal       Cardiovascular Exercise Tolerance: Good negative cardio ROS  Rhythm:Regular Rate:Normal     Neuro/Psych PSYCHIATRIC DISORDERS (anxiety - took xanax today) negative neurological ROS     GI/Hepatic negative GI ROS, Neg liver ROS,   Endo/Other  negative endocrine ROS  Renal/GU negative Renal ROS  Female GU complaint     Musculoskeletal  (+) Arthritis - (hands),   Abdominal   Peds  Hematology negative hematology ROS (+)   Anesthesia Other Findings   Reproductive/Obstetrics negative OB ROS                          Anesthesia Physical  Anesthesia Plan  ASA: II  Anesthesia Plan: General ETT   Post-op Pain Management:    Induction: Intravenous  Airway Management Planned: LMA  Additional Equipment:   Intra-op Plan:   Post-operative Plan: Extubation in OR  Informed Consent: I have reviewed the patients History and Physical, chart, labs and discussed the procedure including the risks, benefits and alternatives for the proposed anesthesia with the patient or authorized representative who has indicated his/her understanding and acceptance.   Dental advisory given  Plan Discussed with: CRNA, Anesthesiologist and Surgeon  Anesthesia Plan Comments:         Anesthesia Quick Evaluation

## 2012-11-24 NOTE — Anesthesia Postprocedure Evaluation (Signed)
  Anesthesia Post-op Note  Patient: [name]  Procedure(s) Performed: Procedure(s): LAPAROSCOPIC ASSISTED VAGINAL HYSTERECTOMY (N/A) SALPINGO OOPHORECTOMY (Bilateral) Patient is awake and responsive. Pain and nausea are reasonably well controlled. Vital signs are stable and clinically acceptable. Oxygen saturation is clinically acceptable. There are no apparent anesthetic complications at this time. Patient is ready for discharge.

## 2012-11-24 NOTE — H&P (Signed)
Status unchanged will proceed with LAVH BSO possible TAH BSO.

## 2012-11-24 NOTE — Transfer of Care (Signed)
Immediate Anesthesia Transfer of Care Note  Patient: [name]  Procedure(s) Performed: Procedure(s): LAPAROSCOPIC ASSISTED VAGINAL HYSTERECTOMY (N/A) SALPINGO OOPHORECTOMY (Bilateral)  Patient Location: PACU  Anesthesia Type:General  Level of Consciousness: awake, alert , oriented and patient cooperative  Airway & Oxygen Therapy: Patient Spontanous Breathing and Patient connected to nasal cannula oxygen  Post-op Assessment: Report given to PACU RN and Post -op Vital signs reviewed and stable  Post vital signs: Reviewed and stable  Complications: No apparent anesthesia complications

## 2012-11-24 NOTE — Op Note (Signed)
NAMEOLINA, MELFI NO.:  [id number]  MEDICAL RECORD NO.:  [id number]  LOCATION:  9305                          FACILITY:  [facility]  PHYSICIAN:  Miguel Aschoff, M.D.       DATE OF BIRTH:  August 26, 1960  DATE OF PROCEDURE:  11/24/2012 DATE OF DISCHARGE:                              OPERATIVE REPORT   PREOPERATIVE DIAGNOSIS:  Atypical adenomatous hyperplasia of the endometrium.  POSTOPERATIVE DIAGNOSIS:  Atypical adenomatous hyperplasia of the endometrium.  PROCEDURE:  Laparoscopically-assisted vaginal hysterectomy and bilateral salpingo-oophorectomy.  SURGEON:  Miguel Aschoff, M.D.  ASSISTANT:  Philip Aspen, DO  ANESTHESIA:  General.  COMPLICATIONS:  None.  JUSTIFICATION:  The patient is a 52 year old, white female, who presented with postmenopausal bleeding.  The patient underwent Pipelle biopsy in the office which revealed cells suggestive of endometrial carcinoma.  She was taken to the operating room to undergo D and C and hysteroscopy.  The final pathology report on the specimen revealed atypical adenomatous hyperplasia to be present with atypia.  Because of that, she is being taken to the operating room at this time to undergo definitive therapy via hysterectomy and bilateral salpingo-oophorectomy. The risks and benefits of the procedure were discussed with the patient. Informed consent has been obtained.  PROCEDURE:  The patient was taken to the operating room, placed in the supine position, and general anesthesia was administered without difficulty.  She was then placed in the dorsal lithotomy position, modified prepped and draped in the usual sterile fashion, and a Foley catheter was inserted, as well as a Hulka tenaculum to manipulate the uterus.  Attention was then directed to the umbilicus, where a small infraumbilical incision was made.  A Veress needle was inserted and the abdomen was insufflated with 3 L of CO2.  Following the insufflation, the  trocar to laparoscope was placed, followed by the laparoscope itself.  Then under direct visualization, accessory ports were established in the right lower quadrant and the left lower quadrant. These were 5 mm ports.  Inspection at this point revealed the uterus to be normal in size.  Anterior bladder peritoneum was unremarkable.  The round ligaments were unremarkable.  The tubes were normal along the course.  The fimbriae were fine and delicate.  The ovaries were also within normal limits.  There was no lesions noted in the cul-de-sac. Inspection of the abdomen did not reveal any other abnormalities.  At this point, the right infundibulopelvic ligament was identified, elevated, and with care to avoid the ureter.  Using a gyrus unit, the ligament was cauterized and then cut, and then dissection continued along the mesovarium ligament and mesosalpinx using serial cauterization from the cuts until the level of the uterus was reached.  At this point, the round ligament was identified, grasped, cauterized, and cut.  Then using the gyrus unit, additional bites were taken along the broad ligament structures and this continued with serial cauterizations and cuts to the level of the uterine vessels.  The identical procedure was then carried out on the left side once again, care to avoid any injury to the ureter and again with dissection proceeding identically as it did on the right side to the level  of the uterine vessels.  Once this was done, attention was directed to the vaginal portion of the procedure. The patient was placed in high lithotomy position.  Speculum was placed in the vaginal vault.  The cervix was grasped with a tenaculum and then injected with a total of 10 mL of 1% Xylocaine with epinephrine.  The cervical mucosa was then circumscribed and dissected anteriorly and posteriorly until the peritoneal reflections were found.  The peritoneum was then entered posteriorly.  Then using  curved Heaney clamps, uterosacral ligaments were clamped, cut, and suture ligated using suture ligatures of 0 Vicryl.  The cardinal ligaments were clamped, cut, and suture ligated in a similar fashion.  Additional bites were taken of the paracervical fascia using curved Heaney clamps.  Again, all pedicles suture ligated using suture ligatures of 0 Vicryl.  It was then possible to use 2 additional bites to free the specimen consisting of the cervix, uterus, tubes, and ovaries.  These additional pedicles again were all ligated using suture ligatures of 0 Vicryl.  At this point, inspection was made for hemostasis.  Hemostasis appeared to be excellent.  The posterior vaginal cuff was then run using running interlocking 0 Vicryl suture.  At this point, the cuff was closed using running interlocking 0 Vicryl suture.  The patient was then placed back in the modified lithotomy position.  The abdomen re-insufflated to ensure that there was no intra-abdominal bleeding at the operative sites.  Inspection revealed excellent hemostasis.  At this point, it was elected to complete the procedure.  The CO2 was allowed to escape.  All instruments removed and small incisions were then closed using subcuticular 4-0 Vicryl.  The port sites were then injected with 0.25% Marcaine for postop analgesia. The estimated blood loss from the procedure was approximately 30 mL. The patient tolerated the procedure well and went to the recovery room in satisfactory condition.  PLAN:  For the patient to be observed overnight with possible discharge home on November 25, 2012.     Miguel Aschoff, M.D.     AR/MEDQ  D:  11/24/2012  T:  11/24/2012  Job:  [id number]

## 2012-11-24 NOTE — Brief Op Note (Signed)
11/24/2012  1:10 PM  PATIENT:  [name]  52 y.o. female  PRE-OPERATIVE DIAGNOSIS:  ATYPICAL ADENOMYOSIS HYPERPLASIA  POST-OPERATIVE DIAGNOSIS:  ATYPICAL ADENOMYOSIS HYPERPLASIA  PROCEDURE:  Procedure(s): LAPAROSCOPIC ASSISTED VAGINAL HYSTERECTOMY (N/A) SALPINGO OOPHORECTOMY (Bilateral)  SURGEON:  Surgeon(s) and Role:    * Miguel Aschoff, MD - Primary    * Philip Aspen, DO - Assisting  ANESTHESIA:   general  EBL:  Total I/O In: 2100 [I.V.:2100] Out: 250 [Urine:200; Blood:50]  BLOOD ADMINISTERED:none  DRAINS: Urinary Catheter (Foley)   LOCAL MEDICATIONS USED:  MARCAINE     SPECIMEN:  Source of Specimen:  uterus tubes and ovaries  DISPOSITION OF SPECIMEN:  PATHOLOGY  COUNTS:  YES  TOURNIQUET:  * No tourniquets in log *  DICTATION: .Other Dictation: Dictation Number [id number]  PLAN OF CARE: Admit for overnight observation  PATIENT DISPOSITION:  PACU - hemodynamically stable.    Delay start of Pharmacological VTE agent (>24hrs) due to surgical blood loss or risk of bleeding: PAS hose appplied

## 2012-11-25 ENCOUNTER — Encounter (HOSPITAL_COMMUNITY): Payer: Self-pay | Admitting: Obstetrics and Gynecology

## 2012-11-25 LAB — CBC
Hemoglobin: 11.2 g/dL — ABNORMAL LOW (ref 12.0–15.0)
MCH: 28.6 pg (ref 26.0–34.0)
MCHC: 32.9 g/dL (ref 30.0–36.0)
Platelets: 206 10*3/uL (ref 150–400)
RDW: 14.2 % (ref 11.5–15.5)

## 2012-11-25 MED ORDER — PNEUMOCOCCAL VAC POLYVALENT 25 MCG/0.5ML IJ INJ
0.5000 mL | INJECTION | INTRAMUSCULAR | Status: DC
  Filled 2012-11-25: qty 0.5

## 2012-11-25 MED ORDER — PROMETHAZINE HCL 25 MG/ML IJ SOLN
25.0000 mg | Freq: Four times a day (QID) | INTRAMUSCULAR | Status: DC | PRN
  Administered 2012-11-25: 25 mg via INTRAVENOUS
  Filled 2012-11-25: qty 1

## 2012-11-25 MED ORDER — KETOROLAC TROMETHAMINE 30 MG/ML IJ SOLN
30.0000 mg | Freq: Four times a day (QID) | INTRAMUSCULAR | Status: DC | PRN
  Administered 2012-11-25 (×3): 30 mg via INTRAVENOUS
  Filled 2012-11-25 (×3): qty 1

## 2012-11-25 NOTE — Progress Notes (Signed)
S: Stable through the night. Had some nausea,.Did not sleep well  O: Afebrile  BP 129/81  Pulse 81  Abdomen soft non tender wounds healing well  Lab  Hg 11.2  WBC 9.6  Impression: Stable post op with nausea  Plan: Add toradol            Add phenergan  Ambulate and slowly increase diet

## 2012-11-25 NOTE — Anesthesia Postprocedure Evaluation (Signed)
  Anesthesia Post-op Note  Patient: [name]  Procedure(s) Performed: Procedure(s): LAPAROSCOPIC ASSISTED VAGINAL HYSTERECTOMY (N/A) SALPINGO OOPHORECTOMY (Bilateral)  [facility]  Anesthesia Type:General  Level of Consciousness: awake, alert  and oriented  Airway and Oxygen Therapy: Patient Spontanous Breathing and Patient connected to nasal cannula oxygen  Post-op Pain: mild  Post-op Assessment: Post-op Vital signs reviewed and Patient's Cardiovascular Status Stable  Post-op Vital Signs: Reviewed and stable  Complications: No apparent anesthesia complications

## 2012-11-26 NOTE — Progress Notes (Signed)
S: Doing well this AM. Taking PO and PO pain meds.  O: Afebrile  BP 140/67   Pulse 70  Abdomen soft non tender  Impression: Stable post op  PLan: D/C home               RTC in 4 weeks               Meds: Percocet 5/325                Resume other meds.               Condition improved               Path pending.

## 2012-11-26 NOTE — Progress Notes (Signed)
Discharge instructions reviewed with patient.  Patient states understanding of home care, activity, medications, signs/symptoms to report to  MD and return MD office visit in 2 weeks.  No home equipment needed.  Patient ambulated for discharge instable condition with staff without incident.

## 2012-12-12 NOTE — Discharge Summary (Signed)
[name], [name] NO.:  [id number]  MEDICAL RECORD NO.:  [id number]  LOCATION:  9305                          FACILITY:  [facility]  PHYSICIAN:  Miguel Aschoff, M.D.       DATE OF BIRTH:  04-08-1961  DATE OF ADMISSION:  11/24/2012 DATE OF DISCHARGE:  11/26/2012                              DISCHARGE SUMMARY   ADMISSION DIAGNOSIS:  Atypical adenomatous hyperplasia of the endometrium.  FINAL DIAGNOSIS:  Stage I endometrial carcinoma.  OPERATIONS AND PROCEDURES:  Laparoscopically assisted vaginal hysterectomy and bilateral salpingo-oophorectomy.  BRIEF HISTORY:  The patient is a 52 year old white female who was initially assessed in the office for postmenopausal bleeding with a biopsy suggestive of endometrial carcinoma.  She was ultimately taken to the operating room, at which time D and C and hysteroscopy was carried out with final pathology report only showing atypical adenomatous hyperplasia.  The patient was evaluated by the GYN Oncology Department at the [facility], and after reviewing the biopsies and the D and C report, felt that the patient needed only to proceed with hysterectomy that further radical surgery was not indicated.  The patient was therefore brought to the [facility] on November 24, 2012 to undergo laparoscopically-assisted vaginal hysterectomy and bilateral salpingo-oophorectomy.  HOSPITAL COURSE:  Preoperative studies were obtained.  This included admission hemoglobin of 13.7.  Chemistry profile was essentially within normal limits as was PT and PTT.  On November 24, 2012, a laparoscopically- assisted vaginal hysterectomy and bilateral salpingo-oophorectomy were carried out without difficulty.  The patient's hospital course was essentially uncomplicated.  She did tolerate increasing ambulation and diet well.  Her hemoglobin reached a nadir of 11.2, she otherwise remained stable, and by the second postoperative day, was felt to be  in satisfactory condition and stable enough to be discharged home.  MEDICATIONS FOR HOME:  Included Percocet 1 every 3-4 hours as needed for pain.  DISCHARGE INSTRUCTIONS:  She was instructed to do no heavy lifting, to place nothing in the vagina, to return to the office in 4 weeks for followup examination, and to call for any problems such as fever, pain, or heavy bleeding.  The final pathology report on the hysterectomy specimen revealed endometrioid adenocarcinoma, FIGO grade 1, partially involving an endometrial polyp.  The adenocarcinoma involved the myometrium only superficially, there was no evidence of tumor in the adnexa.  PLAN:  The plan is for the patient to return to the office in 4 weeks for followup examination.  At that time, options of further treatment will be discussed with the patient.     Miguel Aschoff, M.D.     AR/MEDQ  D:  12/11/2012  T:  12/12/2012  Job:  [id number]

## 2013-05-14 ENCOUNTER — Encounter (HOSPITAL_COMMUNITY): Payer: Self-pay | Admitting: Emergency Medicine

## 2013-05-14 ENCOUNTER — Emergency Department (HOSPITAL_COMMUNITY)
Admission: EM | Admit: 2013-05-14 | Discharge: 2013-05-14 | Disposition: A | Discharge status: Home / Self Care | Payer: 59 | Attending: Emergency Medicine | Admitting: Emergency Medicine

## 2013-05-14 DIAGNOSIS — IMO0002 Reserved for concepts with insufficient information to code with codable children: Secondary | ICD-10-CM | POA: Insufficient documentation

## 2013-05-14 DIAGNOSIS — Y9389 Activity, other specified: Secondary | ICD-10-CM | POA: Insufficient documentation

## 2013-05-14 DIAGNOSIS — Z79899 Other long term (current) drug therapy: Secondary | ICD-10-CM | POA: Insufficient documentation

## 2013-05-14 DIAGNOSIS — Z8679 Personal history of other diseases of the circulatory system: Secondary | ICD-10-CM | POA: Insufficient documentation

## 2013-05-14 DIAGNOSIS — Y929 Unspecified place or not applicable: Secondary | ICD-10-CM | POA: Insufficient documentation

## 2013-05-14 DIAGNOSIS — R296 Repeated falls: Secondary | ICD-10-CM | POA: Insufficient documentation

## 2013-05-14 DIAGNOSIS — Z8739 Personal history of other diseases of the musculoskeletal system and connective tissue: Secondary | ICD-10-CM | POA: Insufficient documentation

## 2013-05-14 DIAGNOSIS — Z87891 Personal history of nicotine dependence: Secondary | ICD-10-CM | POA: Insufficient documentation

## 2013-05-14 DIAGNOSIS — M545 Low back pain, unspecified: Secondary | ICD-10-CM

## 2013-05-14 DIAGNOSIS — F411 Generalized anxiety disorder: Secondary | ICD-10-CM | POA: Insufficient documentation

## 2013-05-14 MED ORDER — OXYCODONE-ACETAMINOPHEN 5-325 MG PO TABS
1.0000 | ORAL_TABLET | Freq: Once | ORAL | Status: AC
  Administered 2013-05-14: 1 via ORAL
  Filled 2013-05-14: qty 1

## 2013-05-14 MED ORDER — METHOCARBAMOL 500 MG PO TABS: 500.0000 mg | ORAL_TABLET | Freq: Two times a day (BID) | ORAL | Status: DC

## 2013-05-14 MED ORDER — HYDROCODONE-ACETAMINOPHEN 5-325 MG PO TABS: 1.0000 | ORAL_TABLET | Freq: Four times a day (QID) | ORAL | Status: DC | PRN

## 2013-05-14 MED ORDER — NAPROXEN 500 MG PO TABS: 500.0000 mg | ORAL_TABLET | Freq: Two times a day (BID) | ORAL | Status: DC

## 2013-05-14 NOTE — ED Notes (Signed)
Pt reports reaching around for her keys in the backseat appx 1 month ago. Reports lower back pain since that mildly resolved but has increased since Thursday when she lifted a 4 gallon water jug. Pt now reports constant lower back pain, worse with movement or palpation to area. Pt took flexeril but reports minimal relief. Denies urinary/bowel incontinence.

## 2013-05-14 NOTE — ED Provider Notes (Signed)
Medical screening examination/treatment/procedure(s) were performed by non-physician practitioner and as supervising physician I was immediately available for consultation/collaboration.  EKG Interpretation   None         Dagmar Hait, MD 05/14/13 [phone]

## 2013-05-14 NOTE — ED Notes (Signed)
Pt states 3 weeks ago she turned to pick her keys up when they fell behind her car seat.  Since then she has been experiencing increasing bil lower back pain.  Pt took 1 muscle relaxer with minimal relief.

## 2013-05-14 NOTE — ED Provider Notes (Signed)
CSN: [id number]     Arrival date & time 05/14/13  1516 History  This chart was scribed for non-physician practitioner, Felicie Morn, NP-C working with Dagmar Hait, MD by Greggory Stallion, ED scribe. This patient was seen in room TR11C/TR11C and the patient's care was started at 3:54 PM.   Chief Complaint  Patient presents with  . Back Pain   The history is provided by the patient. No language interpreter was used.   HPI Comments: [name] is a 52 y.o. female who presents to the Emergency Department complaining of gradual onset, worsening lower back pain that started 3 weeks ago when she turned to pick her keys up when they fell behind her car seat. Pt has taken one flexeril with temporary relief and warm compresses with some relief. Denies bowel or bladder incontinence. States she has history of muscle spasms in her back.   Past Medical History  Diagnosis Date  . SVD (spontaneous vaginal delivery) 1988  . Anxiety   . Allergy to cats   . Arthritis     hands  . Peripheral vascular disease     blood clot in back of leg at age 66yr    Past Surgical History  Procedure Laterality Date  . Cesarean section  1981  . Tubal ligation    . Nose repair Bilateral 1983  . Hysteroscopy w/d&c N/A 10/11/2012    Procedure: DILATATION AND CURETTAGE /HYSTEROSCOPY;  Surgeon: Miguel Aschoff, MD;  Location: [facility];  Service: Gynecology;  Laterality: N/A;  . Laparoscopic assisted vaginal hysterectomy N/A 11/24/2012    Procedure: LAPAROSCOPIC ASSISTED VAGINAL HYSTERECTOMY;  Surgeon: Miguel Aschoff, MD;  Location: [facility];  Service: Gynecology;  Laterality: N/A;  . Salpingoophorectomy Bilateral 11/24/2012    Procedure: SALPINGO OOPHORECTOMY;  Surgeon: Miguel Aschoff, MD;  Location: [facility];  Service: Gynecology;  Laterality: Bilateral;   Family History  Problem Relation Age of Onset  . Cancer Mother     Blood cancer  . Leukemia Father   . Cancer Paternal Grandmother    History  Substance Use Topics  .  Smoking status: Former Smoker -- 1.00 packs/day for 34 years    Types: Cigarettes    Quit date: 07/04/2011  . Smokeless tobacco: Never Used  . Alcohol Use: Yes     Comment: socially   OB History   Grav Para Term Preterm Abortions TAB SAB Ect Mult Living                 Review of Systems  Genitourinary:       Negative for bowel or bladder incontinence.   Musculoskeletal: Positive for back pain and myalgias.  All other systems reviewed and are negative.    Allergies  Morphine and related  Home Medications   Current Outpatient Rx  Name  Route  Sig  Dispense  Refill  . ALPRAZolam (XANAX) 0.5 MG tablet   Oral   Take 0.5 mg by mouth at bedtime.         . Ascorbic Acid (VITAMIN C) 1000 MG tablet   Oral   Take 1,000 mg by mouth daily.         . Calcium Carbonate-Vitamin D (CALCIUM + D PO)   Oral   Take 1 tablet by mouth daily.         . cyclobenzaprine (FLEXERIL) 10 MG tablet   Oral   Take 10 mg by mouth 2 (two) times daily.         . Multiple Vitamin (  MULTIVITAMIN WITH MINERALS) TABS   Oral   Take 1 tablet by mouth daily.         Marland Kitchen PRESCRIPTION MEDICATION   Vaginal   Place 1 application vaginally 3 (three) times a week. "Estrovan" vaginal cream         . TESTOSTERONE IM   Intramuscular   Inject into the muscle once.         . vitamin E 400 UNIT capsule   Oral   Take 400 Units by mouth daily.         Marland Kitchen zolpidem (AMBIEN) 5 MG tablet   Oral   Take 5 mg by mouth at bedtime.           BP 160/80  Pulse 76  Temp(Src) 97.4 F (36.3 C) (Oral)  Resp 16  Wt 194 lb (87.998 kg)  SpO2 100%  Physical Exam  Nursing note and vitals reviewed. Constitutional: She is oriented to person, place, and time. She appears well-developed and well-nourished. No distress.  HENT:  Head: Normocephalic and atraumatic.  Eyes: EOM are normal.  Neck: Neck supple. No tracheal deviation present.  Cardiovascular: Normal rate, regular rhythm and normal heart sounds.    Pulmonary/Chest: Effort normal and breath sounds normal. No respiratory distress. She has no wheezes. She has no rhonchi. She has no rales.  Musculoskeletal: Normal range of motion.  Low back pain with spasms.   Neurological: She is alert and oriented to person, place, and time.  Strength intact in lower extremities.   Skin: Skin is warm and dry.  Psychiatric: She has a normal mood and affect. Her behavior is normal.    ED Course  Procedures (including critical care time)  DIAGNOSTIC STUDIES: Oxygen Saturation is 100% on RA, normal by my interpretation.    COORDINATION OF CARE: 3:57 PM-Discussed treatment plan which includes an antiinflammatory and robaxin with pt at bedside and pt agreed to plan.   Labs Review Labs Reviewed - No data to display Imaging Review No results found.  EKG Interpretation   None       MDM  Low back pain.  No red flag symptoms.   I personally performed the services described in this documentation, which was scribed in my presence. The recorded information has been reviewed and is accurate.   Jimmye Norman, NP 05/14/13 [phone]

## 2013-10-18 ENCOUNTER — Other Ambulatory Visit (HOSPITAL_COMMUNITY): Payer: Self-pay | Admitting: Internal Medicine

## 2013-10-18 DIAGNOSIS — R079 Chest pain, unspecified: Secondary | ICD-10-CM

## 2013-10-23 ENCOUNTER — Telehealth (HOSPITAL_COMMUNITY): Payer: Self-pay

## 2013-10-26 ENCOUNTER — Ambulatory Visit (HOSPITAL_COMMUNITY)
Admission: RE | Admit: 2013-10-26 | Discharge: 2013-10-26 | Disposition: A | Discharge status: Home / Self Care | Payer: 59 | Source: Ambulatory Visit | Attending: Internal Medicine | Admitting: Internal Medicine

## 2013-10-26 DIAGNOSIS — R079 Chest pain, unspecified: Secondary | ICD-10-CM | POA: Insufficient documentation

## 2013-12-07 NOTE — Telephone Encounter (Signed)
Encounter complete. 

## 2014-04-30 ENCOUNTER — Other Ambulatory Visit: Payer: Self-pay | Admitting: Obstetrics & Gynecology

## 2014-05-01 LAB — CYTOLOGY - PAP

## 2014-10-29 ENCOUNTER — Encounter (HOSPITAL_COMMUNITY): Payer: Self-pay | Admitting: Emergency Medicine

## 2014-10-29 ENCOUNTER — Emergency Department (INDEPENDENT_AMBULATORY_CARE_PROVIDER_SITE_OTHER)
Admission: EM | Admit: 2014-10-29 | Discharge: 2014-10-29 | Disposition: A | Discharge status: Home / Self Care | Payer: 59 | Source: Home / Self Care | Attending: Family Medicine | Admitting: Family Medicine

## 2014-10-29 DIAGNOSIS — M25562 Pain in left knee: Secondary | ICD-10-CM

## 2014-10-29 DIAGNOSIS — S161XXA Strain of muscle, fascia and tendon at neck level, initial encounter: Secondary | ICD-10-CM

## 2014-10-29 MED ORDER — NAPROXEN 500 MG PO TABS: 500.0000 mg | ORAL_TABLET | Freq: Two times a day (BID) | ORAL | Status: DC

## 2014-10-29 MED ORDER — CYCLOBENZAPRINE HCL 10 MG PO TABS: 10.0000 mg | ORAL_TABLET | Freq: Every evening | ORAL | Status: DC | PRN

## 2014-10-29 NOTE — ED Provider Notes (Signed)
[name] is a 54 y.o. female who presents to Urgent Care today for neck pain. Patient has a one-week history of right sided cervical neck pain. The pain does not radiate. Pain is moderate and worse with activity. No weakness or numbness bowel bladder dysfunction or difficulty walking. She has had episodic cervical pain off and on for the last 20 years or so. Additionally patient notes left knee pain. This is been present for about a week and is located in the anterior lateral aspect of the knee and is worse with descending stairs. She denies any locking catching or giving way. She's tried some ibuprofen for both of her pains which has helped a little.   Past Medical History  Diagnosis Date  . SVD (spontaneous vaginal delivery) 1988  . Anxiety   . Allergy to cats   . Arthritis     hands  . Peripheral vascular disease     blood clot in back of leg at age 21yr    Past Surgical History  Procedure Laterality Date  . Cesarean section  1981  . Tubal ligation    . Nose repair Bilateral 1983  . Hysteroscopy w/d&c N/A 10/11/2012    Procedure: DILATATION AND CURETTAGE /HYSTEROSCOPY;  Surgeon: Gus Height, MD;  Location: [facility];  Service: Gynecology;  Laterality: N/A;  . Laparoscopic assisted vaginal hysterectomy N/A 11/24/2012    Procedure: LAPAROSCOPIC ASSISTED VAGINAL HYSTERECTOMY;  Surgeon: Gus Height, MD;  Location: [facility];  Service: Gynecology;  Laterality: N/A;  . Salpingoophorectomy Bilateral 11/24/2012    Procedure: SALPINGO OOPHORECTOMY;  Surgeon: Gus Height, MD;  Location: [facility];  Service: Gynecology;  Laterality: Bilateral;   History  Substance Use Topics  . Smoking status: Former Smoker -- 1.00 packs/day for 34 years    Types: Cigarettes    Quit date: 07/04/2011  . Smokeless tobacco: Never Used  . Alcohol Use: Yes     Comment: socially   ROS as above Medications: No current facility-administered medications for this encounter.   Current Outpatient Prescriptions  Medication  Sig Dispense Refill  . zolpidem (AMBIEN) 5 MG tablet Take 5 mg by mouth at bedtime.     . ALPRAZolam (XANAX) 0.5 MG tablet Take 0.5 mg by mouth at bedtime.    . Ascorbic Acid (VITAMIN C) 1000 MG tablet Take 1,000 mg by mouth daily.    . Calcium Carbonate-Vitamin D (CALCIUM + D PO) Take 1 tablet by mouth daily.    . cyclobenzaprine (FLEXERIL) 10 MG tablet Take 1 tablet (10 mg total) by mouth at bedtime as needed for muscle spasms. 20 tablet 0  . HYDROcodone-acetaminophen (NORCO/VICODIN) 5-325 MG per tablet Take 1 tablet by mouth every 6 (six) hours as needed for severe pain. 8 tablet 0  . methocarbamol (ROBAXIN) 500 MG tablet Take 1 tablet (500 mg total) by mouth 2 (two) times daily. 20 tablet 0  . Multiple Vitamin (MULTIVITAMIN WITH MINERALS) TABS Take 1 tablet by mouth daily.    . naproxen (NAPROSYN) 500 MG tablet Take 1 tablet (500 mg total) by mouth 2 (two) times daily. 30 tablet 0  . PRESCRIPTION MEDICATION Place 1 application vaginally 3 (three) times a week. "Estrovan" vaginal cream    . rosuvastatin (CRESTOR) 10 MG tablet Take 10 mg by mouth daily.    . TESTOSTERONE IM Inject into the muscle once.    . vitamin E 400 UNIT capsule Take 400 Units by mouth daily.     Allergies  Allergen Reactions  . Morphine And  Related Nausea And Vomiting     Exam:  BP 152/76 mmHg  Pulse 75  Temp(Src) 98.2 F (36.8 C) (Oral)  Resp 20  SpO2 97% Gen: Well NAD HEENT: EOMI,  MMM Lungs: Normal work of breathing. CTABL Heart: RRR no MRG Abd: NABS, Soft. Nondistended, Nontender Exts: Brisk capillary refill, warm and well perfused.  Neck: Nontender to spinal midline. Tender palpation right cervical paraspinal and trapezius. Neck range of motion is normal negative Spurling's test. Upper extremity reflexes are equal and normal bilaterally. Upper extremity strength is equal and normal bilaterally. Sensation is intact throughout. Pulses are intact at the wrists bilaterally. Knee left: Normal-appearing no  swelling. Tender to palpation anterior lateral joint line. Negative McMurray's test stable ligamentous exam normal motion.   No results found for this or any previous visit (from the past 24 hour(s)). No results found.  Assessment and Plan: 54 y.o. female with cervical myofascial strain with muscle spasm. Treat with physical therapy, naproxen and Flexeril. Home exercise program. Follow up with sports medicine in a few weeks. Knee pain possibly overuse possibly meniscus. As above. Follow-up if not better.  Discussed warning signs or symptoms. Please see discharge instructions. Patient expresses understanding.     Gregor Hams, MD 10/29/14 [phone]
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
## 2014-10-29 NOTE — Discharge Instructions (Signed)
Thank you for coming in today. Take naproxen twice daily for pain. Use Flexeril at bedtime as needed. Use a heating pad. Follow-up with me in [place] after July 11. [id number]  Attend Physical Therapy.   Come back or go to the emergency room if you notice new weakness new numbness problems walking or bowel or bladder problems.   Cervical Strain and Sprain (Whiplash) with Rehab Cervical strain and sprain are injuries that commonly occur with "whiplash" injuries. Whiplash occurs when the neck is forcefully whipped backward or forward, such as during a motor vehicle accident or during contact sports. The muscles, ligaments, tendons, discs, and nerves of the neck are susceptible to injury when this occurs. RISK FACTORS Risk of having a whiplash injury increases if:  Osteoarthritis of the spine.  Situations that make head or neck accidents or trauma more likely.  High-risk sports (football, rugby, wrestling, hockey, auto racing, gymnastics, diving, contact karate, or boxing).  Poor strength and flexibility of the neck.  Previous neck injury.  Poor tackling technique.  Improperly fitted or padded equipment. SYMPTOMS   Pain or stiffness in the front or back of neck or both.  Symptoms may present immediately or up to 24 hours after injury.  Dizziness, headache, nausea, and vomiting.  Muscle spasm with soreness and stiffness in the neck.  Tenderness and swelling at the injury site. PREVENTION  Learn and use proper technique (avoid tackling with the head, spearing, and head-butting; use proper falling techniques to avoid landing on the head).  Warm up and stretch properly before activity.  Maintain physical fitness:  Strength, flexibility, and endurance.  Cardiovascular fitness.  Wear properly fitted and padded protective equipment, such as padded soft collars, for participation in contact sports. PROGNOSIS  Recovery from cervical strain and sprain injuries is  dependent on the extent of the injury. These injuries are usually curable in 1 week to 3 months with appropriate treatment.  RELATED COMPLICATIONS   Temporary numbness and weakness may occur if the nerve roots are damaged, and this may persist until the nerve has completely healed.  Chronic pain due to frequent recurrence of symptoms.  Prolonged healing, especially if activity is resumed too soon (before complete recovery). TREATMENT  Treatment initially involves the use of ice and medication to help reduce pain and inflammation. It is also important to perform strengthening and stretching exercises and modify activities that worsen symptoms so the injury does not get worse. These exercises may be performed at home or with a therapist. For patients who experience severe symptoms, a soft, padded collar may be recommended to be worn around the neck.  Improving your posture may help reduce symptoms. Posture improvement includes pulling your chin and abdomen in while sitting or standing. If you are sitting, sit in a firm chair with your buttocks against the back of the chair. While sleeping, try replacing your pillow with a small towel rolled to 2 inches in diameter, or use a cervical pillow or soft cervical collar. Poor sleeping positions delay healing.  For patients with nerve root damage, which causes numbness or weakness, the use of a cervical traction apparatus may be recommended. Surgery is rarely necessary for these injuries. However, cervical strain and sprains that are present at birth (congenital) may require surgery. MEDICATION   If pain medication is necessary, nonsteroidal anti-inflammatory medications, such as aspirin and ibuprofen, or other minor pain relievers, such as acetaminophen, are often recommended.  Do not take pain medication for 7 days before surgery.  Prescription  pain relievers may be given if deemed necessary by your caregiver. Use only as directed and only as much as you  need. HEAT AND COLD:   Cold treatment (icing) relieves pain and reduces inflammation. Cold treatment should be applied for 10 to 15 minutes every 2 to 3 hours for inflammation and pain and immediately after any activity that aggravates your symptoms. Use ice packs or an ice massage.  Heat treatment may be used prior to performing the stretching and strengthening activities prescribed by your caregiver, physical therapist, or athletic trainer. Use a heat pack or a warm soak. SEEK MEDICAL CARE IF:   Symptoms get worse or do not improve in 2 weeks despite treatment.  New, unexplained symptoms develop (drugs used in treatment may produce side effects). EXERCISES RANGE OF MOTION (ROM) AND STRETCHING EXERCISES - Cervical Strain and Sprain These exercises may help you when beginning to rehabilitate your injury. In order to successfully resolve your symptoms, you must improve your posture. These exercises are designed to help reduce the forward-head and rounded-shoulder posture which contributes to this condition. Your symptoms may resolve with or without further involvement from your physician, physical therapist or athletic trainer. While completing these exercises, remember:   Restoring tissue flexibility helps normal motion to return to the joints. This allows healthier, less painful movement and activity.  An effective stretch should be held for at least 20 seconds, although you may need to begin with shorter hold times for comfort.  A stretch should never be painful. You should only feel a gentle lengthening or release in the stretched tissue. STRETCH- Axial Extensors  Lie on your back on the floor. You may bend your knees for comfort. Place a rolled-up hand towel or dish towel, about 2 inches in diameter, under the part of your head that makes contact with the floor.  Gently tuck your chin, as if trying to make a "double chin," until you feel a gentle stretch at the base of your head.  Hold  __________ seconds. Repeat __________ times. Complete this exercise __________ times per day.  STRETCH - Axial Extension   Stand or sit on a firm surface. Assume a good posture: chest up, shoulders drawn back, abdominal muscles slightly tense, knees unlocked (if standing) and feet hip width apart.  Slowly retract your chin so your head slides back and your chin slightly lowers. Continue to look straight ahead.  You should feel a gentle stretch in the back of your head. Be certain not to feel an aggressive stretch since this can cause headaches later.  Hold for __________ seconds. Repeat __________ times. Complete this exercise __________ times per day. STRETCH - Cervical Side Bend   Stand or sit on a firm surface. Assume a good posture: chest up, shoulders drawn back, abdominal muscles slightly tense, knees unlocked (if standing) and feet hip width apart.  Without letting your nose or shoulders move, slowly tip your right / left ear to your shoulder until your feel a gentle stretch in the muscles on the opposite side of your neck.  Hold __________ seconds. Repeat __________ times. Complete this exercise __________ times per day. STRETCH - Cervical Rotators   Stand or sit on a firm surface. Assume a good posture: chest up, shoulders drawn back, abdominal muscles slightly tense, knees unlocked (if standing) and feet hip width apart.  Keeping your eyes level with the ground, slowly turn your head until you feel a gentle stretch along the back and opposite side of your neck.  Hold __________ seconds. Repeat __________ times. Complete this exercise __________ times per day. RANGE OF MOTION - Neck Circles   Stand or sit on a firm surface. Assume a good posture: chest up, shoulders drawn back, abdominal muscles slightly tense, knees unlocked (if standing) and feet hip width apart.  Gently roll your head down and around from the back of one shoulder to the back of the other. The motion should  never be forced or painful.  Repeat the motion 10-20 times, or until you feel the neck muscles relax and loosen. Repeat __________ times. Complete the exercise __________ times per day. STRENGTHENING EXERCISES - Cervical Strain and Sprain These exercises may help you when beginning to rehabilitate your injury. They may resolve your symptoms with or without further involvement from your physician, physical therapist, or athletic trainer. While completing these exercises, remember:   Muscles can gain both the endurance and the strength needed for everyday activities through controlled exercises.  Complete these exercises as instructed by your physician, physical therapist, or athletic trainer. Progress the resistance and repetitions only as guided.  You may experience muscle soreness or fatigue, but the pain or discomfort you are trying to eliminate should never worsen during these exercises. If this pain does worsen, stop and make certain you are following the directions exactly. If the pain is still present after adjustments, discontinue the exercise until you can discuss the trouble with your clinician. STRENGTH - Cervical Flexors, Isometric  Face a wall, standing about 6 inches away. Place a small pillow, a ball about 6-8 inches in diameter, or a folded towel between your forehead and the wall.  Slightly tuck your chin and gently push your forehead into the soft object. Push only with mild to moderate intensity, building up tension gradually. Keep your jaw and forehead relaxed.  Hold 10 to 20 seconds. Keep your breathing relaxed.  Release the tension slowly. Relax your neck muscles completely before you start the next repetition. Repeat __________ times. Complete this exercise __________ times per day. STRENGTH- Cervical Lateral Flexors, Isometric   Stand about 6 inches away from a wall. Place a small pillow, a ball about 6-8 inches in diameter, or a folded towel between the side of your  head and the wall.  Slightly tuck your chin and gently tilt your head into the soft object. Push only with mild to moderate intensity, building up tension gradually. Keep your jaw and forehead relaxed.  Hold 10 to 20 seconds. Keep your breathing relaxed.  Release the tension slowly. Relax your neck muscles completely before you start the next repetition. Repeat __________ times. Complete this exercise __________ times per day. STRENGTH - Cervical Extensors, Isometric   Stand about 6 inches away from a wall. Place a small pillow, a ball about 6-8 inches in diameter, or a folded towel between the back of your head and the wall.  Slightly tuck your chin and gently tilt your head back into the soft object. Push only with mild to moderate intensity, building up tension gradually. Keep your jaw and forehead relaxed.  Hold 10 to 20 seconds. Keep your breathing relaxed.  Release the tension slowly. Relax your neck muscles completely before you start the next repetition. Repeat __________ times. Complete this exercise __________ times per day. POSTURE AND BODY MECHANICS CONSIDERATIONS - Cervical Strain and Sprain Keeping correct posture when sitting, standing or completing your activities will reduce the stress put on different body tissues, allowing injured tissues a chance to heal and limiting painful  experiences. The following are general guidelines for improved posture. Your physician or physical therapist will provide you with any instructions specific to your needs. While reading these guidelines, remember:  The exercises prescribed by your provider will help you have the flexibility and strength to maintain correct postures.  The correct posture provides the optimal environment for your joints to work. All of your joints have less wear and tear when properly supported by a spine with good posture. This means you will experience a healthier, less painful body.  Correct posture must be practiced  with all of your activities, especially prolonged sitting and standing. Correct posture is as important when doing repetitive low-stress activities (typing) as it is when doing a single heavy-load activity (lifting). PROLONGED STANDING WHILE SLIGHTLY LEANING FORWARD When completing a task that requires you to lean forward while standing in one place for a long time, place either foot up on a stationary 2- to 4-inch high object to help maintain the best posture. When both feet are on the ground, the low back tends to lose its slight inward curve. If this curve flattens (or becomes too large), then the back and your other joints will experience too much stress, fatigue more quickly, and can cause pain.  RESTING POSITIONS Consider which positions are most painful for you when choosing a resting position. If you have pain with flexion-based activities (sitting, bending, stooping, squatting), choose a position that allows you to rest in a less flexed posture. You would want to avoid curling into a fetal position on your side. If your pain worsens with extension-based activities (prolonged standing, working overhead), avoid resting in an extended position such as sleeping on your stomach. Most people will find more comfort when they rest with their spine in a more neutral position, neither too rounded nor too arched. Lying on a non-sagging bed on your side with a pillow between your knees, or on your back with a pillow under your knees will often provide some relief. Keep in mind, being in any one position for a prolonged period of time, no matter how correct your posture, can still lead to stiffness. WALKING Walk with an upright posture. Your ears, shoulders, and hips should all line up. OFFICE WORK When working at a desk, create an environment that supports good, upright posture. Without extra support, muscles fatigue and lead to excessive strain on joints and other tissues. CHAIR:  A chair should be able to  slide under your desk when your back makes contact with the back of the chair. This allows you to work closely.  The chair's height should allow your eyes to be level with the upper part of your monitor and your hands to be slightly lower than your elbows.  Body position:  Your feet should make contact with the floor. If this is not possible, use a foot rest.  Keep your ears over your shoulders. This will reduce stress on your neck and low back. Document Released: 05/11/2005 Document Revised: 09/25/2013 Document Reviewed: 08/23/2008 [facility] Patient Information 2015 [place], [place]. This information is not intended to replace advice given to you by your health care provider. Make sure you discuss any questions you have with your health care provider.   Knee, Cartilage and its Function The menisci are made of tough cartilage, and fit between the surfaces of the bones upon which they rest. The menisci are semi lunar (C-shaped) and have a wedged profile. The wedged profile helps the stability of the joint by keeping  the rounded femur surface from sliding off the flat tibial surface. The menisci are nourished (fed) by small blood vessels; but there is also a large area in the center of the meniscus that does not have a good blood supply (avascular). This presents a problem when there is an injury to the meniscus, as areas without good blood supply heal poorly. When there is a torn cartilage in the knee, surgery is often required to fix this. This is usually done with an arthroscopy (a surgical procedure less invasive than open surgery). Some times open surgery of the knee is required if there is other damage which has occurred. The medial meniscus rests on the medial tibial plateau. The tibia is the large bone in your lower leg (the shin bone). The medial tibial plateau is the upper end of the bone making up the inner part of your knee. The lateral meniscus serves the same purpose and is located on the  outside of the knee. The menisci help to distribute your body weight across the knee joint. Without the meniscus present, the weight of your body would be unevenly applied to the bones in your legs (the femur and tibia). The femur is the large bone in your thigh. This uneven weight distribution would cause increased wear and tear on the cartilage covering the bones, leading to early damage of these areas. The presence of the menisci cartilage is necessary for a healthy knee. PURPOSE OF THE KNEE CARTILAGE The knee joint is made up of three bones: the thigh bone (femur), the shin bone (tibia), and the knee cap (patella). The surfaces of these bones at the knee joint are covered with cartilage. This smooth, slippery surface allows the bones to slide against each other without causing bone damage. The meniscus sits between these cartilaginous surfaces of the bones (femur and tibia). It distributes the weight evenly in the joints and helps with the stability of the joint (keeps the joint steady). HOME CARE INSTRUCTIONS After surgery:  Use crutches as instructed.  Once home, an ice pack applied to your injury may help with discomfort and keep the swelling down. An ice pack can be used for 15-20 minutes 03-04 times per day for the first 2-3 days or as instructed.  Only take over-the-counter or prescription medicines for pain, discomfort, or fever as directed by your caregiver.  Call if you do not have relief of pain with medications or if there is an increase in pain.  Call if your foot becomes cold or blue.  Call if your knee gets stuck in a fixed position and will not move.  You may resume normal diet and activities as directed.  Make sure to keep your appointment with your follow-up caregiver. This injury may require further evaluation and treatment beyond the treatment you received today. MAKE SURE YOU:   Understand these instructions.  Will watch your condition.  Will get help right away if  you are not doing well or get worse. Document Released: 10/31/2001 Document Revised: 08/03/2011 Document Reviewed: 03/13/2009 [facility] Patient Information 2015 [place], [place]. This information is not intended to replace advice given to you by your health care provider. Make sure you discuss any questions you have with your health care provider.

## 2015-01-24 ENCOUNTER — Other Ambulatory Visit: Payer: Self-pay | Admitting: Acute Care

## 2015-01-24 DIAGNOSIS — Z87891 Personal history of nicotine dependence: Secondary | ICD-10-CM

## 2015-01-31 ENCOUNTER — Encounter: Payer: Self-pay | Admitting: Family Medicine

## 2015-01-31 ENCOUNTER — Ambulatory Visit (INDEPENDENT_AMBULATORY_CARE_PROVIDER_SITE_OTHER): Payer: 59 | Admitting: Family Medicine

## 2015-01-31 VITALS — BP 145/92 | HR 82 | Ht 68.5 in | Wt 195.0 lb

## 2015-01-31 DIAGNOSIS — R140 Abdominal distension (gaseous): Secondary | ICD-10-CM | POA: Diagnosis not present

## 2015-01-31 DIAGNOSIS — R0782 Intercostal pain: Secondary | ICD-10-CM

## 2015-01-31 DIAGNOSIS — R079 Chest pain, unspecified: Secondary | ICD-10-CM | POA: Insufficient documentation

## 2015-01-31 DIAGNOSIS — Z23 Encounter for immunization: Secondary | ICD-10-CM | POA: Diagnosis not present

## 2015-01-31 DIAGNOSIS — E669 Obesity, unspecified: Secondary | ICD-10-CM

## 2015-01-31 MED ORDER — POLYETHYLENE GLYCOL 3350 17 GM/SCOOP PO POWD: 17.0000 g | Freq: Every day | ORAL | Status: AC

## 2015-01-31 NOTE — Assessment & Plan Note (Signed)
Unclear etiology. This is been ongoing for years and nonexertional. Patient is scheduled to get a screening low-dose CT scan to screen for lung cancer based on her smoking history. This will replace a chest x-ray in the workup. Otherwise we'll obtain a lipid panel and continue to follow.

## 2015-01-31 NOTE — Progress Notes (Signed)
[name] is a 54 y.o. female who presents to [facility]  today for test chest pain and abdominal pain.  1) abdominal plain. Patient notes discomfort or bloating in her epigastric area present off and on for years. She has a formed stool typically daily but sometimes feels nauseated. She denies any significant vomiting. No fevers or chills. She has not had any dedicated treatment at this time yet. She notes about a year ago she was treated successfully for H. Pylori.  2) chest pain: Patient has years of right-sided pinching sensation in her chest. She states this is nonexertional and not associated with palpitations or shortness of breath. She had a normal exercise stress test about a year ago.  Patient notes that next week she's due to have a low dose screening CT scan of her chest to screen for lung cancer based on her history of more than 30 pack years of smoking. She no longer smokes.  Past Medical History  Diagnosis Date  . SVD (spontaneous vaginal delivery) 1988  . Anxiety   . Allergy to cats   . Arthritis     hands  . Peripheral vascular disease     blood clot in back of leg at age 76yr    Past Surgical History  Procedure Laterality Date  . Cesarean section  1981  . Tubal ligation    . Nose repair Bilateral 1983  . Hysteroscopy w/d&c N/A 10/11/2012    Procedure: DILATATION AND CURETTAGE /HYSTEROSCOPY;  Surgeon: Gus Height, MD;  Location: [facility];  Service: Gynecology;  Laterality: N/A;  . Laparoscopic assisted vaginal hysterectomy N/A 11/24/2012    Procedure: LAPAROSCOPIC ASSISTED VAGINAL HYSTERECTOMY;  Surgeon: Gus Height, MD;  Location: [facility];  Service: Gynecology;  Laterality: N/A;  . Salpingoophorectomy Bilateral 11/24/2012    Procedure: SALPINGO OOPHORECTOMY;  Surgeon: Gus Height, MD;  Location: [facility];  Service: Gynecology;  Laterality: Bilateral;   Social History  Substance Use Topics  . Smoking status: Former Smoker -- 1.00  packs/day for 34 years    Types: Cigarettes    Quit date: 07/04/2011  . Smokeless tobacco: Never Used  . Alcohol Use: Yes     Comment: socially   family history includes Cancer in her mother and paternal grandmother; Leukemia in her father.  ROS as above Medications: Current Outpatient Prescriptions  Medication Sig Dispense Refill  . ALPRAZolam (XANAX) 0.5 MG tablet Take 0.5 mg by mouth at bedtime.    Marland Kitchen zolpidem (AMBIEN) 5 MG tablet Take 5 mg by mouth at bedtime.     . polyethylene glycol powder (GLYCOLAX/MIRALAX) powder Take 17 g by mouth daily. 850 g 1   No current facility-administered medications for this visit.   Allergies  Allergen Reactions  . Morphine And Related Nausea And Vomiting     Exam:  BP 145/92 mmHg  Pulse 82  Ht 5' 8.5" (1.74 m)  Wt 195 lb (88.451 kg)  BMI 29.21 kg/m2 Gen: Well NAD HEENT: EOMI,  MMM Lungs: Normal work of breathing. CTABL Heart: RRR no MRG Chest wall: Nontender Abd: NABS, Soft. Nondistended, Nontender no masses palpated Exts: Brisk capillary refill, warm and well perfused.   No results found for this or any previous visit (from the past 24 hour(s)). No results found.   Please see individual assessment and plan sections.

## 2015-01-31 NOTE — Patient Instructions (Addendum)
Thank you for coming in today. If your belly pain worsens, or you have high fever, bad vomiting, blood in your stool or black tarry stool go to the Emergency Room.  Take MiraLAX daily. We can consider physical therapy for chest. Return in one month. Come back for fasting labs. Call or go to the emergency room if you get worse, have trouble breathing, have chest pains, or palpitations.

## 2015-01-31 NOTE — Assessment & Plan Note (Signed)
Unclear etiology. This is likely patient or constipation. IBS. Plan to treat with MiraLAX. Obtain metabolic panel and lipid panel.

## 2015-01-31 NOTE — Assessment & Plan Note (Signed)
Work on weight loss. Recheck blood pressure next visit. Labs pending today.

## 2015-02-05 ENCOUNTER — Ambulatory Visit (INDEPENDENT_AMBULATORY_CARE_PROVIDER_SITE_OTHER)
Admission: RE | Admit: 2015-02-05 | Discharge: 2015-02-05 | Disposition: A | Discharge status: Home / Self Care | Payer: 59 | Source: Ambulatory Visit | Attending: Acute Care | Admitting: Acute Care

## 2015-02-05 ENCOUNTER — Encounter: Payer: Self-pay | Admitting: Acute Care

## 2015-02-05 ENCOUNTER — Ambulatory Visit (INDEPENDENT_AMBULATORY_CARE_PROVIDER_SITE_OTHER): Payer: 59 | Admitting: Acute Care

## 2015-02-05 DIAGNOSIS — Z87891 Personal history of nicotine dependence: Secondary | ICD-10-CM

## 2015-02-05 LAB — CBC
HEMATOCRIT: 39.1 % (ref 36.0–46.0)
Hemoglobin: 12.6 g/dL (ref 12.0–15.0)
MCH: 28.1 pg (ref 26.0–34.0)
MCHC: 32.2 g/dL (ref 30.0–36.0)
MCV: 87.3 fL (ref 78.0–100.0)
MPV: 10.1 fL (ref 8.6–12.4)
PLATELETS: 268 10*3/uL (ref 150–400)
RBC: 4.48 MIL/uL (ref 3.87–5.11)
RDW: 14.5 % (ref 11.5–15.5)
WBC: 5.9 10*3/uL (ref 4.0–10.5)

## 2015-02-05 LAB — COMPREHENSIVE METABOLIC PANEL
ALK PHOS: 79 U/L (ref 33–130)
ALT: 20 U/L (ref 6–29)
AST: 17 U/L (ref 10–35)
Albumin: 4 g/dL (ref 3.6–5.1)
BILIRUBIN TOTAL: 0.4 mg/dL (ref 0.2–1.2)
BUN: 13 mg/dL (ref 7–25)
CALCIUM: 9.2 mg/dL (ref 8.6–10.4)
CO2: 30 mmol/L (ref 20–31)
Chloride: 103 mmol/L (ref 98–110)
Creat: 0.73 mg/dL (ref 0.50–1.05)
GLUCOSE: 96 mg/dL (ref 65–99)
Potassium: 4.7 mmol/L (ref 3.5–5.3)
Sodium: 139 mmol/L (ref 135–146)
Total Protein: 7.2 g/dL (ref 6.1–8.1)

## 2015-02-05 LAB — LIPID PANEL
CHOL/HDL RATIO: 6.2 ratio — AB (ref <=5.0)
Cholesterol: 224 mg/dL — ABNORMAL HIGH (ref 125–200)
HDL: 36 mg/dL — ABNORMAL LOW (ref >=46)
LDL CALC: 142 mg/dL — AB (ref <130)
Triglycerides: 228 mg/dL — ABNORMAL HIGH (ref <150)
VLDL: 46 mg/dL — AB (ref <30)

## 2015-02-05 LAB — VITAMIN D 25 HYDROXY (VIT D DEFICIENCY, FRACTURES): Vit D, 25-Hydroxy: 34 ng/mL (ref 30–100)

## 2015-02-05 LAB — TSH: TSH: 2.434 u[IU]/mL (ref 0.350–4.500)

## 2015-02-05 LAB — FERRITIN: Ferritin: 99 ng/mL (ref 10–291)

## 2015-02-05 LAB — LIPASE: LIPASE: 19 U/L (ref 7–60)

## 2015-02-05 MED ORDER — ATORVASTATIN CALCIUM 20 MG PO TABS: 20.0000 mg | ORAL_TABLET | Freq: Every day | ORAL | Status: AC

## 2015-02-05 NOTE — Progress Notes (Signed)
Shared Decision Making Visit Lung Cancer Screening Program [phone])   Eligibility:  Age 54 y.o.  Pack Years Smoking History Calculation 51 pack year smoking history (# packs/per year x # years smoked)  Recent History of coughing up blood  no  Unexplained weight loss? no ( >Than 15 pounds within the last 6 months )  Prior History Lung / other cancer no (Diagnosis within the last 5 years already requiring surveillance chest CT Scans).  Smoking Status Former Smoker  Former Smokers: Years since quit: 3 years  Quit Date: 07/04/2011  Visit Components:  Discussion included one or more decision making aids. yes  Discussion included risk/benefits of screening. yes  Discussion included potential follow up diagnostic testing for abnormal scans. yes  Discussion included meaning and risk of over diagnosis. yes  Discussion included meaning and risk of False Positives. yes  Discussion included meaning of total radiation exposure. yes  Counseling Included:  Importance of adherence to annual lung cancer LDCT screening. yes  Impact of comorbidities on ability to participate in the program. yes  Ability and willingness to under diagnostic treatment. yes  Smoking Cessation Counseling:  Current Smokers:   Discussed importance of smoking cessation. NA; Former smoker  Information about tobacco cessation classes and interventions provided to patient.NA  Patient provided with "ticket" for LDCT Scan. {NA  Symptomatic Patient. no  Counseling: NA  Diagnosis Code: Tobacco Use Z72.0  Asymptomatic Patient yes  Counseling NA  Former Smokers:   Discussed the importance of maintaining cigarette abstinence. yes  Diagnosis Code: Personal History of Nicotine Dependence. G18.299  Information about tobacco cessation classes and interventions provided to patient. Yes  Patient provided with "ticket" for LDCT Scan. yes  Written Order for Lung Cancer Screening with LDCT placed in Epic.  Yes (CT Chest Lung Cancer Screening Low Dose W/O CM) [id number] Z12.2-Screening of respiratory organs Z87.891-Personal history of nicotine dependence  I spent 15 minutes of face to face time with Ms. Midkiff discussing the risks and benefits of the Lung Cancer Screening Program. We viewed a power point together that reviewed the above noted topics, pausing and stopping at intervals to allow for discussion and for questions to be asked and answered to ensure understanding.We discussed that she quit smoking 3 years ago which was the single most powerful action she could have taken to decrease her risk of lung cancer. We discussed the importance of having the screening every year to allow for comparison of the scans over time. I have given Ms. Matos a copy of the power point we reviewed and my card and contact information in the event she has any further questions regarding the program. We discussed the time and location of the scan. She verbalized understanding of all of the above and had no further questions as she left the office. I told her I would call her the results of the scan within 48 hours. She verbalized understanding.   Magdalen Spatz, NP

## 2015-02-05 NOTE — Addendum Note (Signed)
Addended by: Gregor Hams on: 02/05/2015 07:49 AM   Modules accepted: Orders

## 2015-02-06 ENCOUNTER — Telehealth: Payer: Self-pay | Admitting: Acute Care

## 2015-02-06 NOTE — Telephone Encounter (Signed)
I called the results of Ms. Geraci's CT scan. I explained that the scan was read as a Lung RADS 2, nodules with a very low likelihood of becoming a clinically active cancer due to size or lack of growth. I explained that the recommendation is for a repeat scan in 12 months as part of her preventative health maintenance. She verbalized understanding,and had no further questions for me. She has my contact information in the event she has any further questions in the future. I told her we would call her in 12 months to schedule her next scan. She verbalized understanding.Marland Kitchen

## 2015-02-28 ENCOUNTER — Ambulatory Visit: Payer: 59 | Admitting: Family Medicine

## 2015-06-19 ENCOUNTER — Other Ambulatory Visit: Payer: Self-pay | Admitting: Internal Medicine

## 2015-06-19 DIAGNOSIS — M25561 Pain in right knee: Secondary | ICD-10-CM

## 2015-06-21 ENCOUNTER — Ambulatory Visit
Admission: RE | Admit: 2015-06-21 | Discharge: 2015-06-21 | Disposition: A | Discharge status: Home / Self Care | Payer: 59 | Source: Ambulatory Visit | Attending: Internal Medicine | Admitting: Internal Medicine

## 2015-06-21 DIAGNOSIS — M25561 Pain in right knee: Secondary | ICD-10-CM

## 2015-07-08 ENCOUNTER — Other Ambulatory Visit: Payer: Self-pay | Admitting: Obstetrics & Gynecology

## 2015-07-09 LAB — CYTOLOGY - PAP

## 2015-08-14 ENCOUNTER — Other Ambulatory Visit: Payer: Self-pay | Admitting: Acute Care

## 2015-08-14 DIAGNOSIS — Z87891 Personal history of nicotine dependence: Secondary | ICD-10-CM

## 2015-12-16 ENCOUNTER — Other Ambulatory Visit (HOSPITAL_COMMUNITY): Payer: Self-pay | Admitting: Internal Medicine

## 2015-12-16 DIAGNOSIS — R1033 Periumbilical pain: Secondary | ICD-10-CM

## 2015-12-18 ENCOUNTER — Ambulatory Visit (HOSPITAL_COMMUNITY)
Admission: RE | Admit: 2015-12-18 | Discharge: 2015-12-18 | Disposition: A | Discharge status: Home / Self Care | Payer: 59 | Source: Ambulatory Visit | Attending: Internal Medicine | Admitting: Internal Medicine

## 2015-12-18 DIAGNOSIS — R1033 Periumbilical pain: Secondary | ICD-10-CM | POA: Diagnosis not present

## 2016-01-16 ENCOUNTER — Encounter: Payer: Self-pay | Admitting: Family Medicine

## 2016-01-18 ENCOUNTER — Emergency Department (HOSPITAL_COMMUNITY): Payer: 59

## 2016-01-18 ENCOUNTER — Encounter (HOSPITAL_COMMUNITY): Payer: Self-pay | Admitting: Emergency Medicine

## 2016-01-18 ENCOUNTER — Emergency Department (HOSPITAL_COMMUNITY)
Admission: EM | Admit: 2016-01-18 | Discharge: 2016-01-18 | Disposition: A | Discharge status: Home / Self Care | Payer: 59 | Attending: Emergency Medicine | Admitting: Emergency Medicine

## 2016-01-18 DIAGNOSIS — J069 Acute upper respiratory infection, unspecified: Secondary | ICD-10-CM

## 2016-01-18 DIAGNOSIS — R21 Rash and other nonspecific skin eruption: Secondary | ICD-10-CM | POA: Diagnosis present

## 2016-01-18 DIAGNOSIS — Z87891 Personal history of nicotine dependence: Secondary | ICD-10-CM | POA: Insufficient documentation

## 2016-01-18 MED ORDER — FLUORESCEIN SODIUM 1 MG OP STRP
1.0000 | ORAL_STRIP | Freq: Once | OPHTHALMIC | Status: AC
  Administered 2016-01-18: 1 via OPHTHALMIC
  Filled 2016-01-18: qty 1

## 2016-01-18 MED ORDER — VALACYCLOVIR HCL 1 G PO TABS: 1000.0000 mg | ORAL_TABLET | Freq: Three times a day (TID) | ORAL | 0 refills | Status: AC

## 2016-01-18 MED ORDER — PREDNISONE 20 MG PO TABS
60.0000 mg | ORAL_TABLET | Freq: Once | ORAL | Status: AC
  Administered 2016-01-18: 60 mg via ORAL
  Filled 2016-01-18: qty 3

## 2016-01-18 MED ORDER — VALACYCLOVIR HCL 500 MG PO TABS
1000.0000 mg | ORAL_TABLET | Freq: Once | ORAL | Status: AC
  Administered 2016-01-18: 1000 mg via ORAL
  Filled 2016-01-18: qty 2

## 2016-01-18 MED ORDER — PREDNISONE 20 MG PO TABS: 60.0000 mg | ORAL_TABLET | Freq: Every day | ORAL | 0 refills | Status: AC

## 2016-01-18 MED ORDER — PROPARACAINE HCL 0.5 % OP SOLN
1.0000 [drp] | Freq: Once | OPHTHALMIC | Status: AC
  Administered 2016-01-18: 1 [drp] via OPHTHALMIC
  Filled 2016-01-18: qty 15

## 2016-01-18 NOTE — ED Notes (Signed)
PA at bedside at this time.  

## 2016-01-18 NOTE — Discharge Instructions (Signed)
Please follow with your primary care doctor in the next 2 days for a check-up. They must obtain records for further management.  ° °Do not hesitate to return to the Emergency Department for any new, worsening or concerning symptoms.  ° °

## 2016-01-18 NOTE — ED Notes (Signed)
Patient transported to X-ray via stretcher 

## 2016-01-18 NOTE — ED Triage Notes (Signed)
Pt states she has headaches x3 nights, nausea, poor appetite, "dont feel good". Pt also has a bump on the right side of her head that itches that shes concerned about shingles. Pt has no neuro deficits. ambulatory with steady gait, denies pain.

## 2016-01-18 NOTE — ED Notes (Signed)
Woods lamp on bedside table.

## 2016-01-18 NOTE — ED Provider Notes (Signed)
[facility] DEPT Provider Note   CSN: [id number] Arrival date & time: 01/18/16  2038     History   Chief Complaint Chief Complaint  Patient presents with  . Migraine  . Rash  . Fatigue    HPI  Blood pressure (!) 176/103, pulse (!) 58, temperature 98.6 F (37 C), temperature source Oral, resp. rate 16, height 5' 8.5" (1.74 m), weight 83.2 kg, SpO2 100 %.  [name] is a 55 y.o. female complaining of productive cough with rhinorrhea and right frontal headache onset 3 days ago now essentially resolved, she had an itch on the right temple yesterday and when she woke up this morning the area was swollen and she denies pain, she did have chickenpox as a child, states that her mother had shingles with no pain. She denies change in her vision, fever, chills, chest pain, shortness of breath.  HPI  Past Medical History:  Diagnosis Date  . Allergy to cats   . Anxiety   . Arthritis    hands  . Peripheral vascular disease ([place])    blood clot in back of leg at age 73yr   . SVD (spontaneous vaginal delivery) 1988    Patient Active Problem List   Diagnosis Date Noted  . Obese 01/31/2015  . Abdominal bloating 01/31/2015  . Chest pain 01/31/2015  . 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 CESAREAN SECTION  1981  . HYSTEROSCOPY W/D&C N/A 10/11/2012   Procedure: DILATATION AND CURETTAGE /HYSTEROSCOPY;  Surgeon: Gus Height, MD;  Location: [facility];  Service: Gynecology;  Laterality: N/A;  . LAPAROSCOPIC ASSISTED VAGINAL HYSTERECTOMY N/A 11/24/2012   Procedure: LAPAROSCOPIC ASSISTED VAGINAL HYSTERECTOMY;  Surgeon: Gus Height, MD;  Location: [facility];  Service: Gynecology;  Laterality: N/A;  . nose repair Bilateral 1983  . SALPINGOOPHORECTOMY Bilateral 11/24/2012   Procedure: SALPINGO OOPHORECTOMY;  Surgeon: Gus Height, MD;  Location: [facility];  Service: Gynecology;  Laterality: Bilateral;  . TUBAL LIGATION      OB  History    No data available       Home Medications    Prior to Admission medications   Medication Sig Start Date End Date Taking? Authorizing Provider  ALPRAZolam Duanne Moron) 0.5 MG tablet Take 0.5 mg by mouth at bedtime.    Historical Provider, MD  atorvastatin (LIPITOR) 20 MG tablet Take 1 tablet (20 mg total) by mouth daily. 02/05/15   Gregor Hams, MD  polyethylene glycol powder (GLYCOLAX/MIRALAX) powder Take 17 g by mouth daily. 01/31/15   Gregor Hams, MD  predniSONE (DELTASONE) 20 MG tablet Take 3 tablets (60 mg total) by mouth daily. Take 60 mg by mouth daily week 1, then 40mg  by mouth daily for week 2, then 20mg  daily for week 3 01/18/16   Elmyra Ricks Sakshi Sermons, PA-C  valACYclovir (VALTREX) 1000 MG tablet Take 1 tablet (1,000 mg total) by mouth 3 (three) times daily. 01/18/16   Lachina Salsberry, PA-C  zolpidem (AMBIEN) 5 MG tablet Take 5 mg by mouth at bedtime.     Historical Provider, MD    Family History Family History  Problem Relation Age of Onset  . Cancer Mother     Blood cancer  . Leukemia Father   . Cancer Paternal Grandmother     Social History Social History  Substance Use Topics  . Smoking status: Former Smoker    Packs/day: 1.00    Years: 34.00    Types: Cigarettes  Quit date: 07/04/2011  . Smokeless tobacco: Never Used  . Alcohol use Yes     Comment: socially     Allergies   Morphine and related   Review of Systems Review of Systems  10 systems reviewed and found to be negative, except as noted in the HPI.   Physical Exam Updated Vital Signs BP (!) 176/103   Pulse (!) 58   Temp 98.6 F (37 C) (Oral)   Resp 16   Ht 5' 8.5" (1.74 m)   Wt 83.2 kg   SpO2 100%   BMI 27.48 kg/m   Physical Exam  Constitutional: She is oriented to person, place, and time. She appears well-developed and well-nourished. No distress.  HENT:  Head: Normocephalic and atraumatic.  Nose: Nose normal.  Mouth/Throat: Oropharynx is clear and moist.  Area of edema with  maculopapular lesions no vesicles on the right temple, no erythema, warmth or tenderness to palpation. Bilateral tympanic membranes with normal architecture and good light reflex, no lesions on the tip of the nose. No abnormal up is take or ferning on fluorescein stain.  Eyes: Conjunctivae and EOM are normal. Pupils are equal, round, and reactive to light.  No TTP of maxillary or frontal sinuses  No TTP or induration of temporal arteries bilaterally  Neck: Normal range of motion. Neck supple.  FROM to C-spine. Pt can touch chin to chest without discomfort. No TTP of midline cervical spine.   Cardiovascular: Normal rate, regular rhythm and intact distal pulses.   Pulmonary/Chest: Effort normal and breath sounds normal. No respiratory distress. She has no wheezes. She has no rales. She exhibits no tenderness.  Abdominal: Soft. Bowel sounds are normal. There is no tenderness.  Musculoskeletal: Normal range of motion. She exhibits no edema or tenderness.  Neurological: She is alert and oriented to person, place, and time. No cranial nerve deficit.  II-Visual fields grossly intact. III/IV/VI-Extraocular movements intact.  Pupils reactive bilaterally. V/VII-Smile symmetric, equal eyebrow raise,  facial sensation intact VIII- Hearing grossly intact IX/X-Normal gag XI-bilateral shoulder shrug XII-midline tongue extension Motor: 5/5 bilaterally with normal tone and bulk Cerebellar: Normal finger-to-nose  and normal heel-to-shin test.   Romberg negative Ambulates with a coordinated gait   Skin: She is not diaphoretic.  Psychiatric: She has a normal mood and affect.  Nursing note and vitals reviewed.    ED Treatments / Results  Labs (all labs ordered are listed, but only abnormal results are displayed) Labs Reviewed - No data to display  EKG  EKG Interpretation None       Radiology Dg Chest 2 View  Result Date: 01/18/2016 CLINICAL DATA:  Productive cough for 3 weeks.  Former  smoker. EXAM: CHEST  2 VIEW COMPARISON:  CT chest 02/05/2015 FINDINGS: The heart size and mediastinal contours are within normal limits. Both lungs are clear. The visualized skeletal structures are unremarkable. IMPRESSION: No active cardiopulmonary disease. Electronically Signed   By: Lucienne Capers M.D.   On: 01/18/2016 21:55    Procedures Procedures (including critical care time)  Medications Ordered in ED Medications  proparacaine (ALCAINE) 0.5 % ophthalmic solution 1 drop (1 drop Both Eyes Given 01/18/16 2136)  fluorescein ophthalmic strip 1 strip (1 strip Both Eyes Given 01/18/16 2136)  valACYclovir (VALTREX) tablet 1,000 mg (1,000 mg Oral Given 01/18/16 2312)  predniSONE (DELTASONE) tablet 60 mg (60 mg Oral Given 01/18/16 2312)     Initial Impression / Assessment and Plan / ED Course  I have reviewed the triage vital signs  and the nursing notes.  Pertinent labs & imaging results that were available during my care of the patient were reviewed by me and considered in my medical decision making (see chart for details).  Clinical Course    Vitals:   01/18/16 2045 01/18/16 2046 01/18/16 2200 01/18/16 2318  BP: 165/100  161/87 (!) 176/103  Pulse: 67  71 (!) 58  Resp: 18   16  Temp: 98.6 F (37 C)     TempSrc: Oral     SpO2: 100%  98% 100%  Weight:  83.2 kg    Height:  5' 8.5" (1.74 m)      Medications  proparacaine (ALCAINE) 0.5 % ophthalmic solution 1 drop (1 drop Both Eyes Given 01/18/16 2136)  fluorescein ophthalmic strip 1 strip (1 strip Both Eyes Given 01/18/16 2136)  valACYclovir (VALTREX) tablet 1,000 mg (1,000 mg Oral Given 01/18/16 2312)  predniSONE (DELTASONE) tablet 60 mg (60 mg Oral Given 01/18/16 2312)    Ammarie Rund is 55 y.o. female presenting with Resolved headache, productive cough, lesion to the left temple, not exactly typical herpes zoster in its physical appearance and lack of pain however, out of an abundance of caution will treat as we can catch within  the 48-hour window. Lung sounds are clear to auscultation, she saturating well on room air, no tachypnea or tachycardia. In chest x-rays negative.   Evaluation does not show pathology that would require ongoing emergent intervention or inpatient treatment. Pt is hemodynamically stable and mentating appropriately. Discussed findings and plan with patient/guardian, who agrees with care plan. All questions answered. Return precautions discussed and outpatient follow up given.      Final Clinical Impressions(s) / ED Diagnoses   Final diagnoses:  Rash  URI (upper respiratory infection)    New Prescriptions Discharge Medication List as of 01/18/2016 11:17 PM    START taking these medications   Details  predniSONE (DELTASONE) 20 MG tablet Take 3 tablets (60 mg total) by mouth daily. Take 60 mg by mouth daily week 1, then 40mg  by mouth daily for week 2, then 20mg  daily for week 3, Starting Sat 01/18/2016, Print    valACYclovir (VALTREX) 1000 MG tablet Take 1 tablet (1,000 mg total) by mouth 3 (three) times daily., Starting Sat 01/18/2016, [employer], PA-C 01/19/16 0003    Charlesetta Shanks, MD 01/19/16 [phone]

## 2016-02-06 ENCOUNTER — Ambulatory Visit (INDEPENDENT_AMBULATORY_CARE_PROVIDER_SITE_OTHER)
Admission: RE | Admit: 2016-02-06 | Discharge: 2016-02-06 | Disposition: A | Discharge status: Home / Self Care | Payer: 59 | Source: Ambulatory Visit | Attending: Acute Care | Admitting: Acute Care

## 2016-02-06 DIAGNOSIS — Z87891 Personal history of nicotine dependence: Secondary | ICD-10-CM

## 2016-02-10 ENCOUNTER — Telehealth: Payer: Self-pay | Admitting: Acute Care

## 2016-02-10 DIAGNOSIS — Z87891 Personal history of nicotine dependence: Secondary | ICD-10-CM

## 2016-02-10 NOTE — Telephone Encounter (Signed)
I have called [name] with the results of her low-dose screening CT. I explained to her that her scan was read as a Lung RADS 2: indicating nodules that are benign in appearance and behavior with a very low likelihood of becoming a clinically active cancer due to size or lack of growth. Recommendation per radiology is for a repeat LDCT in 12 months. We will order and schedule scan for September 2018. We also discussed the fact that her scan indicated aortic atherosclerosis and coronary artery calcification. I explained that this scan is a non-gated exam, and cannot give degree or severity of stenosis or calcifications. [name] is currently taking Lipitor for elevated cholesterol per her primary care provider. I will fax a copy of the scan results to the patient's primary care provider so that he can follow up as he feels is clinically indicated. I did reassure [name] that she is being treated with a statin, however encouraged her to talk to her primary care physician regarding any further workup necessary. She verbalized understanding and had no further questions at completion of the call. [name] has my contact information in the event that she has any questions in the future.

## 2017-02-08 ENCOUNTER — Inpatient Hospital Stay: Admission: RE | Admit: 2017-02-08 | Payer: 59 | Source: Ambulatory Visit

## 2017-02-16 ENCOUNTER — Ambulatory Visit (INDEPENDENT_AMBULATORY_CARE_PROVIDER_SITE_OTHER)
Admission: RE | Admit: 2017-02-16 | Discharge: 2017-02-16 | Disposition: A | Discharge status: Home / Self Care | Payer: 59 | Source: Ambulatory Visit | Attending: Acute Care | Admitting: Acute Care

## 2017-02-16 DIAGNOSIS — Z87891 Personal history of nicotine dependence: Secondary | ICD-10-CM

## 2017-02-18 ENCOUNTER — Other Ambulatory Visit: Payer: Self-pay | Admitting: Acute Care

## 2017-02-18 DIAGNOSIS — Z122 Encounter for screening for malignant neoplasm of respiratory organs: Secondary | ICD-10-CM

## 2017-02-18 DIAGNOSIS — Z87891 Personal history of nicotine dependence: Secondary | ICD-10-CM

## 2017-04-01 ENCOUNTER — Other Ambulatory Visit (HOSPITAL_COMMUNITY): Payer: Self-pay | Admitting: Internal Medicine

## 2017-04-01 ENCOUNTER — Ambulatory Visit (HOSPITAL_COMMUNITY)
Admission: RE | Admit: 2017-04-01 | Discharge: 2017-04-01 | Disposition: A | Discharge status: Home / Self Care | Payer: 59 | Source: Ambulatory Visit | Attending: Vascular Surgery | Admitting: Vascular Surgery

## 2017-04-01 DIAGNOSIS — M79662 Pain in left lower leg: Secondary | ICD-10-CM

## 2017-08-07 ENCOUNTER — Other Ambulatory Visit: Payer: Self-pay

## 2017-08-07 ENCOUNTER — Emergency Department (HOSPITAL_COMMUNITY): Payer: 59

## 2017-08-07 ENCOUNTER — Encounter (HOSPITAL_COMMUNITY): Payer: Self-pay

## 2017-08-07 ENCOUNTER — Emergency Department (HOSPITAL_COMMUNITY)
Admission: EM | Admit: 2017-08-07 | Discharge: 2017-08-07 | Disposition: A | Discharge status: Home / Self Care | Payer: 59 | Attending: Physician Assistant | Admitting: Physician Assistant

## 2017-08-07 DIAGNOSIS — R1032 Left lower quadrant pain: Secondary | ICD-10-CM | POA: Diagnosis present

## 2017-08-07 DIAGNOSIS — Z87891 Personal history of nicotine dependence: Secondary | ICD-10-CM | POA: Diagnosis not present

## 2017-08-07 DIAGNOSIS — Z79899 Other long term (current) drug therapy: Secondary | ICD-10-CM | POA: Insufficient documentation

## 2017-08-07 DIAGNOSIS — K5792 Diverticulitis of intestine, part unspecified, without perforation or abscess without bleeding: Secondary | ICD-10-CM

## 2017-08-07 DIAGNOSIS — Z91040 Latex allergy status: Secondary | ICD-10-CM | POA: Insufficient documentation

## 2017-08-07 LAB — URINALYSIS, ROUTINE W REFLEX MICROSCOPIC
BACTERIA UA: NONE SEEN
Bilirubin Urine: NEGATIVE
Glucose, UA: NEGATIVE mg/dL
Ketones, ur: NEGATIVE mg/dL
Nitrite: NEGATIVE
Protein, ur: NEGATIVE mg/dL
SPECIFIC GRAVITY, URINE: 1.011 (ref 1.005–1.030)
pH: 7 (ref 5.0–8.0)

## 2017-08-07 LAB — LIPASE, BLOOD: Lipase: 30 U/L (ref 11–51)

## 2017-08-07 LAB — CBC
HCT: 42.2 % (ref 36.0–46.0)
HEMOGLOBIN: 13.7 g/dL (ref 12.0–15.0)
MCH: 28.5 pg (ref 26.0–34.0)
MCHC: 32.5 g/dL (ref 30.0–36.0)
MCV: 87.7 fL (ref 78.0–100.0)
Platelets: 223 10*3/uL (ref 150–400)
RBC: 4.81 MIL/uL (ref 3.87–5.11)
RDW: 14.3 % (ref 11.5–15.5)
WBC: 11.3 10*3/uL — ABNORMAL HIGH (ref 4.0–10.5)

## 2017-08-07 LAB — COMPREHENSIVE METABOLIC PANEL
ALBUMIN: 4.2 g/dL (ref 3.5–5.0)
ALK PHOS: 143 U/L — AB (ref 38–126)
ALT: 60 U/L — AB (ref 14–54)
ANION GAP: 12 (ref 5–15)
AST: 34 U/L (ref 15–41)
BILIRUBIN TOTAL: 0.7 mg/dL (ref 0.3–1.2)
BUN: 14 mg/dL (ref 6–20)
CALCIUM: 9.5 mg/dL (ref 8.9–10.3)
CO2: 21 mmol/L — AB (ref 22–32)
Chloride: 104 mmol/L (ref 101–111)
Creatinine, Ser: 0.73 mg/dL (ref 0.44–1.00)
GFR calc Af Amer: >60 mL/min (ref >60)
GFR calc non Af Amer: >60 mL/min (ref >60)
GLUCOSE: 110 mg/dL — AB (ref 65–99)
Potassium: 4.7 mmol/L (ref 3.5–5.1)
SODIUM: 137 mmol/L (ref 135–145)
Total Protein: 8.1 g/dL (ref 6.5–8.1)

## 2017-08-07 MED ORDER — OXYCODONE-ACETAMINOPHEN 5-325 MG PO TABS: 1.0000 | ORAL_TABLET | Freq: Four times a day (QID) | ORAL | 0 refills | Status: AC | PRN

## 2017-08-07 MED ORDER — OXYCODONE-ACETAMINOPHEN 5-325 MG PO TABS
1.0000 | ORAL_TABLET | Freq: Once | ORAL | Status: AC
  Administered 2017-08-07: 1 via ORAL
  Filled 2017-08-07: qty 1

## 2017-08-07 MED ORDER — CIPROFLOXACIN HCL 500 MG PO TABS: 500.0000 mg | ORAL_TABLET | Freq: Two times a day (BID) | ORAL | 0 refills | Status: AC

## 2017-08-07 MED ORDER — ONDANSETRON 4 MG PO TBDP: 4.0000 mg | ORAL_TABLET | Freq: Three times a day (TID) | ORAL | 0 refills | Status: AC | PRN

## 2017-08-07 MED ORDER — IOPAMIDOL (ISOVUE-300) INJECTION 61%
INTRAVENOUS | Status: AC
  Filled 2017-08-07: qty 100

## 2017-08-07 MED ORDER — METRONIDAZOLE 500 MG PO TABS: 500.0000 mg | ORAL_TABLET | Freq: Two times a day (BID) | ORAL | 0 refills | Status: AC

## 2017-08-07 MED ORDER — METRONIDAZOLE 500 MG PO TABS
500.0000 mg | ORAL_TABLET | Freq: Once | ORAL | Status: AC
  Administered 2017-08-07: 500 mg via ORAL
  Filled 2017-08-07: qty 1

## 2017-08-07 MED ORDER — ONDANSETRON 4 MG PO TBDP
4.0000 mg | ORAL_TABLET | Freq: Once | ORAL | Status: AC
  Administered 2017-08-07: 4 mg via ORAL
  Filled 2017-08-07: qty 1

## 2017-08-07 MED ORDER — SODIUM CHLORIDE 0.9 % IV BOLUS (SEPSIS)
1000.0000 mL | Freq: Once | INTRAVENOUS | Status: AC
  Administered 2017-08-07: 1000 mL via INTRAVENOUS

## 2017-08-07 MED ORDER — CIPROFLOXACIN HCL 500 MG PO TABS
500.0000 mg | ORAL_TABLET | Freq: Once | ORAL | Status: AC
Start: 2017-08-07 — End: 2017-08-07
  Administered 2017-08-07: 500 mg via ORAL
  Filled 2017-08-07: qty 1

## 2017-08-07 NOTE — ED Provider Notes (Addendum)
[facility] DEPARTMENT Provider Note   CSN: [id number] Arrival date & time: 08/07/17  1046     History   Chief Complaint Chief Complaint  Patient presents with  . Abdominal Pain    HPI Brittania Sudbeck is a 57 y.o. female.  HPI   Patient is a 57 year old female with past medical history significant for diverticulitis, and anxiety.  Patient reports she usually has diverticulitis pain but is able to switch to clear fluids and then gets better in a day or 2.  She reports that this is been ongoing and stronger pain than usual.  She reports in her left lower quadrant.  Mild nausea.  Small amounts of diarrhea.  No dark colored stool or vomit.  No fevers.  Past Medical History:  Diagnosis Date  . Allergy to cats   . Anxiety   . Arthritis    hands  . Peripheral vascular disease ([place])    blood clot in back of leg at age 77yr   . SVD (spontaneous vaginal delivery) 1988    Patient Active Problem List   Diagnosis Date Noted  . Obese 01/31/2015  . Abdominal bloating 01/31/2015  . Chest pain 01/31/2015  . 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 CESAREAN SECTION  1981  . HYSTEROSCOPY W/D&C N/A 10/11/2012   Procedure: DILATATION AND CURETTAGE /HYSTEROSCOPY;  Surgeon: Gus Height, MD;  Location: [facility];  Service: Gynecology;  Laterality: N/A;  . LAPAROSCOPIC ASSISTED VAGINAL HYSTERECTOMY N/A 11/24/2012   Procedure: LAPAROSCOPIC ASSISTED VAGINAL HYSTERECTOMY;  Surgeon: Gus Height, MD;  Location: [facility];  Service: Gynecology;  Laterality: N/A;  . nose repair Bilateral 1983  . SALPINGOOPHORECTOMY Bilateral 11/24/2012   Procedure: SALPINGO OOPHORECTOMY;  Surgeon: Gus Height, MD;  Location: [facility];  Service: Gynecology;  Laterality: Bilateral;  . TUBAL LIGATION      OB History    No data available       Home Medications    Prior to Admission medications   Medication Sig Start Date  End Date Taking? Authorizing Provider  atorvastatin (LIPITOR) 40 MG tablet Take 40 mg by mouth at bedtime.   Yes [provider]  Coenzyme Q10 (CO Q-10 PO) Take 1 capsule by mouth at bedtime.   Yes [provider]  ibuprofen (ADVIL,MOTRIN) 200 MG tablet Take 200 mg by mouth every 6 (six) hours as needed (for pain).   Yes [provider]  KRILL OIL PO Take 1 capsule by mouth at bedtime.   Yes [provider]  testosterone cypionate (DEPO-TESTOSTERONE) 100 MG/ML injection Inject 100 mg into the muscle See admin instructions. Inject 100 mg into the muscle every 9 weeks   Yes [provider]  zolpidem (AMBIEN) 5 MG tablet Take 5 mg by mouth at bedtime.    Yes [provider]  atorvastatin (LIPITOR) 20 MG tablet Take 1 tablet (20 mg total) by mouth daily. Patient not taking: Reported on 08/07/2017 02/05/15   Gregor Hams, MD  polyethylene glycol powder (GLYCOLAX/MIRALAX) powder Take 17 g by mouth daily. Patient not taking: Reported on 08/07/2017 01/31/15   Gregor Hams, MD  predniSONE (DELTASONE) 20 MG tablet Take 3 tablets (60 mg total) by mouth daily. Take 60 mg by mouth daily week 1, then 40mg  by mouth daily for week 2, then 20mg  daily for week 3 Patient not taking: Reported on 08/07/2017 01/18/16   Pisciotta, Elmyra Ricks, PA-C  valACYclovir Estell Harpin)  1000 MG tablet Take 1 tablet (1,000 mg total) by mouth 3 (three) times daily. Patient not taking: Reported on 08/07/2017 01/18/16   Pisciotta, Elmyra Ricks, PA-C    Family History Family History  Problem Relation Age of Onset  . Cancer Mother        Blood cancer  . Leukemia Father   . Cancer Paternal Grandmother     Social History Social History   Tobacco Use  . Smoking status: Former Smoker    Packs/day: 1.00    Years: 34.00    Pack years: 34.00    Types: Cigarettes    Last attempt to quit: 07/04/2011    Years since quitting: 6.0  . Smokeless tobacco: Never Used  Substance Use Topics  . Alcohol  use: Yes    Comment: socially  . Drug use: No     Allergies   Morphine and related and Latex   Review of Systems Review of Systems  Constitutional: Negative for activity change, fatigue and fever.  Respiratory: Negative for shortness of breath.   Cardiovascular: Negative for chest pain.  Gastrointestinal: Positive for abdominal pain, diarrhea and nausea. Negative for constipation and vomiting.  All other systems reviewed and are negative.    Physical Exam Updated Vital Signs BP (!) 152/90   Pulse 77   Temp 99 F (37.2 C) (Oral)   Resp 20   Wt 81.6 kg (180 lb)   SpO2 96%   BMI 26.97 kg/m   Physical Exam  Constitutional: She is oriented to person, place, and time. She appears well-developed and well-nourished.  HENT:  Head: Normocephalic and atraumatic.  Eyes: Right eye exhibits no discharge.  Cardiovascular: Normal rate.  Pulmonary/Chest: Effort normal.  Abdominal: Normal appearance. There is tenderness in the left lower quadrant.  Neurological: She is oriented to person, place, and time.  Skin: Skin is warm and dry. She is not diaphoretic.  Psychiatric: She has a normal mood and affect.  Nursing note and vitals reviewed.    ED Treatments / Results  Labs (all labs ordered are listed, but only abnormal results are displayed) Labs Reviewed  COMPREHENSIVE METABOLIC PANEL - Abnormal; Notable for the following components:      Result Value   CO2 21 (*)    Glucose, Bld 110 (*)    ALT 60 (*)    Alkaline Phosphatase 143 (*)    All other components within normal limits  CBC - Abnormal; Notable for the following components:   WBC 11.3 (*)    All other components within normal limits  URINALYSIS, ROUTINE W REFLEX MICROSCOPIC - Abnormal; Notable for the following components:   Hgb urine dipstick SMALL (*)    Leukocytes, UA TRACE (*)    Squamous Epithelial / LPF 0-5 (*)    All other components within normal limits  LIPASE, BLOOD    EKG  EKG  Interpretation None       Radiology Ct Abdomen Pelvis W Contrast  Result Date: 08/07/2017 CLINICAL DATA:  57 year old female with history of lower abdominal pain for the past 3 days. Nausea for the past 2 days. EXAM: CT ABDOMEN AND PELVIS WITH CONTRAST TECHNIQUE: Multidetector CT imaging of the abdomen and pelvis was performed using the standard protocol following bolus administration of intravenous contrast. CONTRAST:  57 year old female with history of lower abdominal pain for the past 3 days. Nausea for the past 2 days. COMPARISON:  No priors. FINDINGS: Lower chest: Unremarkable. Hepatobiliary: No suspicious cystic or solid hepatic lesions. No intra or  extrahepatic biliary ductal dilatation. Gallbladder is normal in appearance. Pancreas: No pancreatic mass. No pancreatic ductal dilatation. No pancreatic or peripancreatic fluid or inflammatory changes. Spleen: Unremarkable. Adrenals/Urinary Tract: Bilateral kidneys and bilateral adrenal glands are normal in appearance. No hydroureteronephrosis. Urinary bladder is normal in appearance. Stomach/Bowel: Normal appearance of the stomach. No pathologic dilatation of small bowel or colon. The appendix is not confidently identified and may be surgically absent. Regardless, there are no inflammatory changes noted adjacent to the cecum to suggest the presence of an acute appendicitis at this time. Numerous colonic diverticulae are noted. Extensive inflammatory changes are noted adjacent to the mid descending colon, compatible with an acute diverticulitis. No discrete diverticular abscess confidently identified at this time. No definite findings to suggest frank perforation of the bowel. Vascular/Lymphatic: Aortic atherosclerosis, without evidence of aneurysm or dissection in the abdominal or pelvic vasculature. No lymphadenopathy noted in the abdomen or pelvis. Reproductive: Status post hysterectomy. Ovaries are not confidently identified may be surgically absent  or atrophic. Other: No significant volume of ascites.  No pneumoperitoneum. Musculoskeletal: There are no aggressive appearing lytic or blastic lesions noted in the visualized portions of the skeleton. IMPRESSION: 1. Findings are compatible with an acute diverticulitis of the mid descending colon, as detailed above. No discrete diverticular abscess or signs of frank perforation are noted at this time. 2. Aortic atherosclerosis. Aortic Atherosclerosis (ICD10-I70.0). Electronically Signed   By: Vinnie Langton M.D.   On: 08/07/2017 14:41    Procedures Procedures (including critical care time)  Medications Ordered in ED Medications  iopamidol (ISOVUE-300) 61 % injection (not administered)  ciprofloxacin (CIPRO) tablet 500 mg (not administered)  metroNIDAZOLE (FLAGYL) tablet 500 mg (not administered)  oxyCODONE-acetaminophen (PERCOCET/ROXICET) 5-325 MG per tablet 1 tablet (1 tablet Oral Given 08/07/17 1321)  sodium chloride 0.9 % bolus 1,000 mL (1,000 mLs Intravenous New Bag/Given 08/07/17 1321)  ondansetron (ZOFRAN-ODT) disintegrating tablet 4 mg (4 mg Oral Given 08/07/17 1321)     Initial Impression / Assessment and Plan / ED Course  I have reviewed the triage vital signs and the nursing notes.  Pertinent labs & imaging results that were available during my care of the patient were reviewed by me and considered in my medical decision making (see chart for details).      Patient is a 57 year old female with past medical history significant for diverticulitis, and anxiety.  Patient reports she usually has diverticulitis pain but is able to switch to clear fluids and then gets better in a day or 2.  She reports that this is been ongoing and stronger pain than usual.  She reports in her left lower quadrant.  Mild nausea.  Small amounts of diarrhea.  No dark colored stool or vomit.  No fevers.  12:47 PM Will do CT abdomen pelvis given the tenderness or left lower quadrant pain.  Suspect  diverticulitis, want to rule out abscess.  3:41 PM CAT scan shows diverticulitis.  Uncomplicated.  Will treat with antibiotics nausea medicine and pain medicine.  Patient is taking p.o., normal vital signs and appears very well at time of discharge.  Final Clinical Impressions(s) / ED Diagnoses   Final diagnoses:  None    ED Discharge Orders    None       Macarthur Critchley, MD 08/07/17 1541    Macarthur Critchley, MD 08/07/17 1542

## 2017-08-07 NOTE — ED Triage Notes (Signed)
Pt presents to the ed with complaints of lower abdominal pain x 3 days.  Endorses nausea x 2 days, denies any vomiting or diarrhea.

## 2017-08-07 NOTE — Discharge Instructions (Signed)
Patient seen today with diverticulitis.  Please take the pain medication, and antibiotics as prescribed.  We ancitipate that you will get better, if you do not please return immediately to the emergency department

## 2017-08-07 NOTE — ED Notes (Signed)
Patient transported to CT 

## 2018-01-19 IMAGING — CR DG CHEST 2V
2 series · 2 of 2 positions shown · non-contrast
Comparison: CT chest 02/05/2015

CLINICAL DATA: Productive cough for 3 weeks.  Former smoker.

EXAM:
CHEST  2 VIEW

[chest pa]
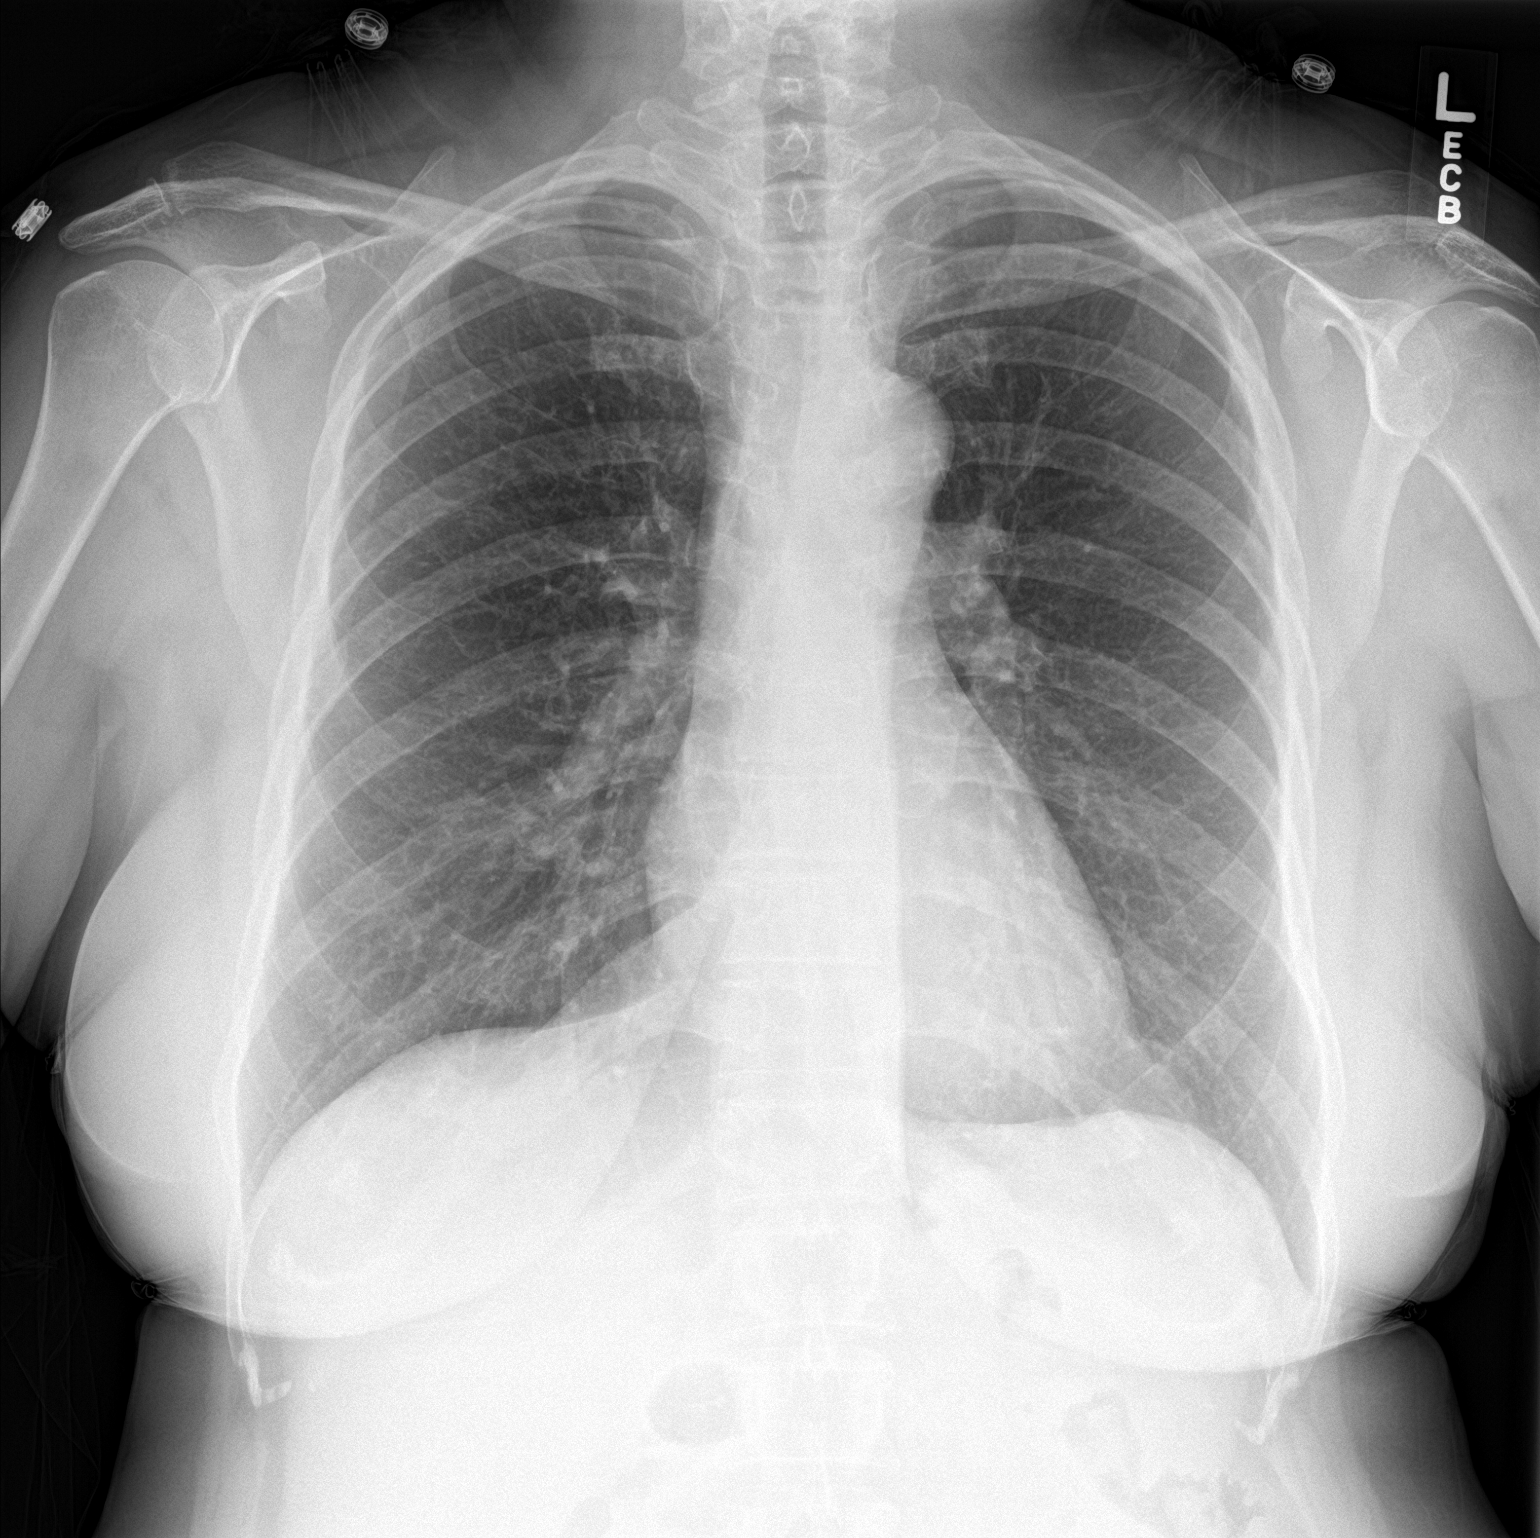

[chest lat]
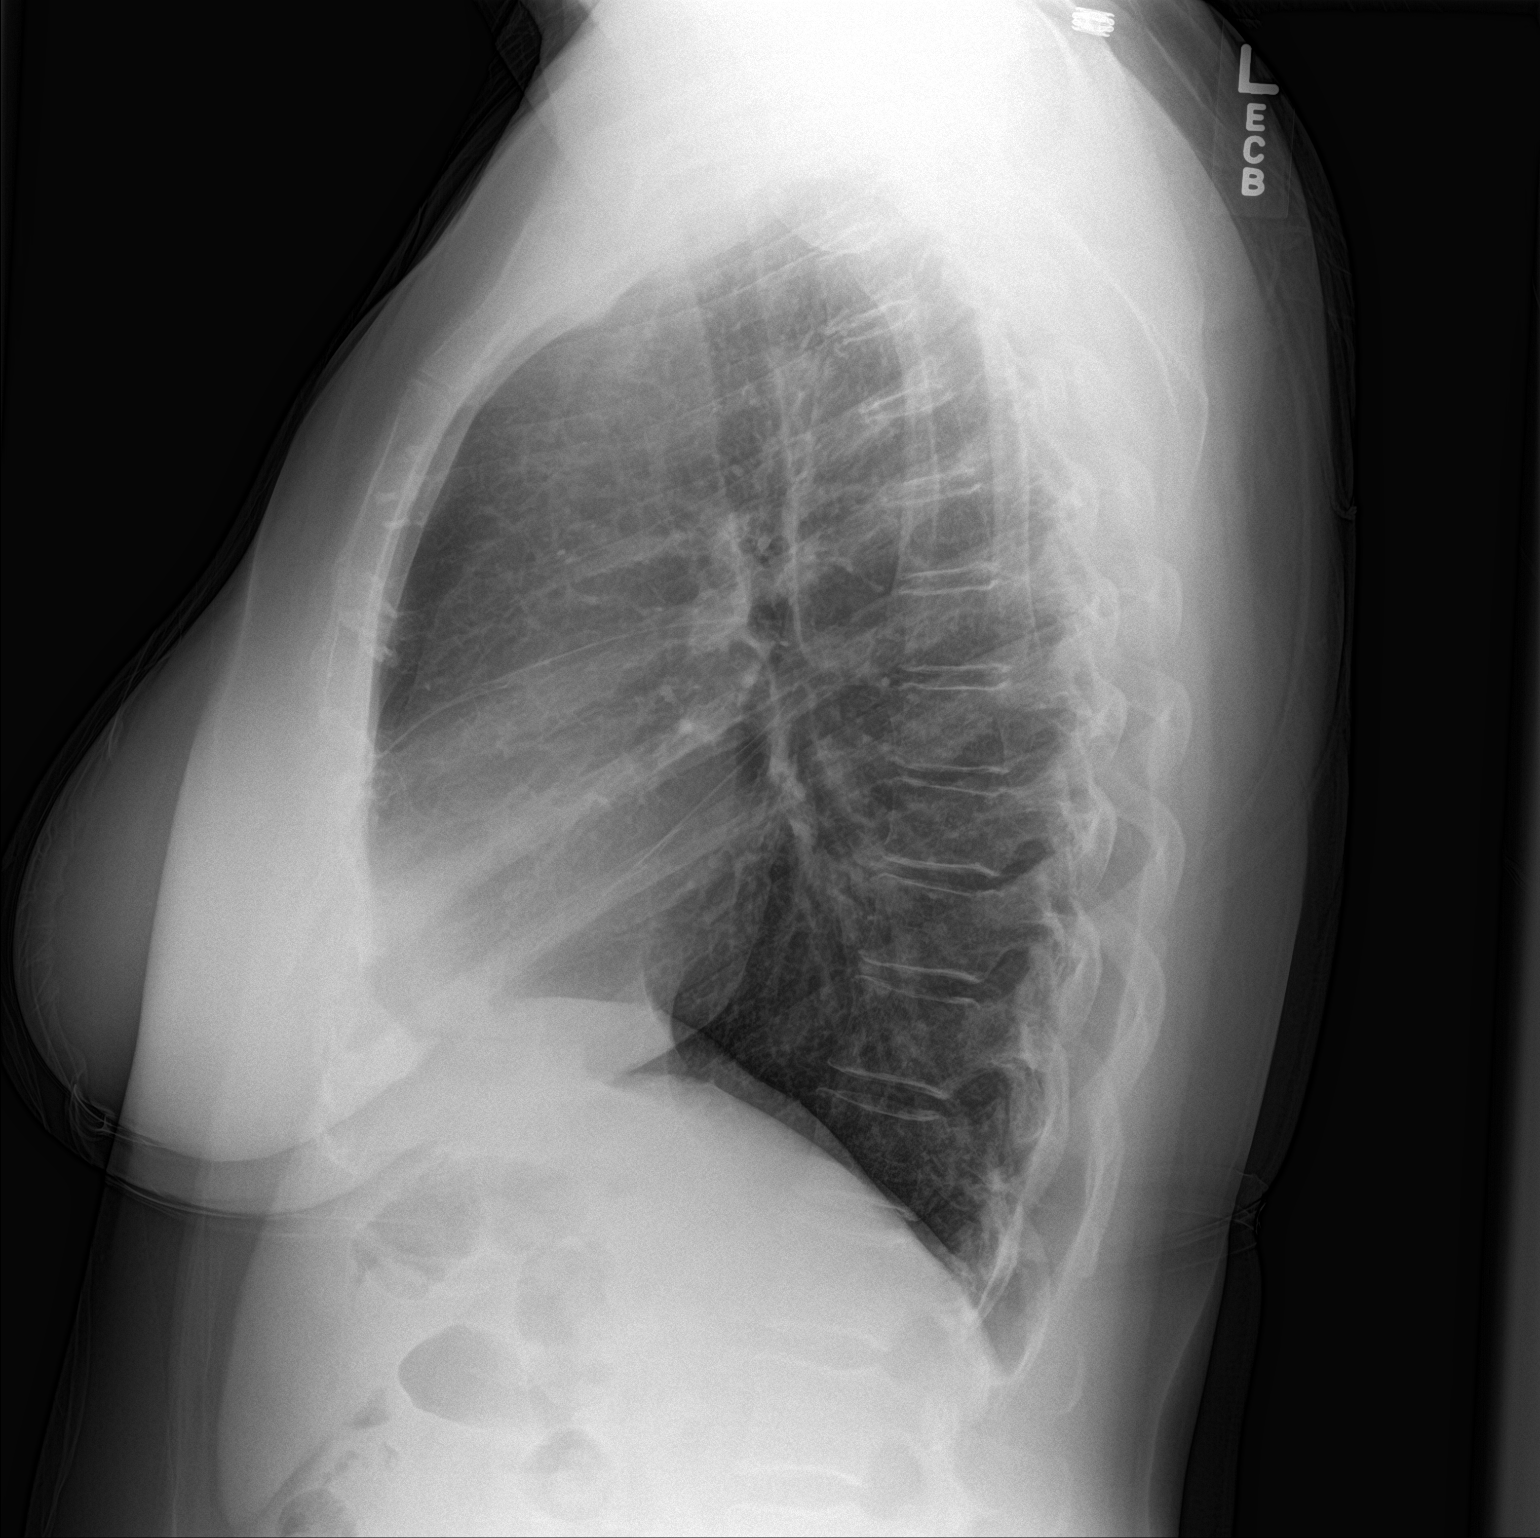

[2 of 2 positions shown; findings below may reference images not displayed]

FINDINGS: The heart size and mediastinal contours are within normal limits.
Both lungs are clear. The visualized skeletal structures are
unremarkable.
IMPRESSION: No active cardiopulmonary disease.

## 2018-02-25 ENCOUNTER — Ambulatory Visit (INDEPENDENT_AMBULATORY_CARE_PROVIDER_SITE_OTHER)
Admission: RE | Admit: 2018-02-25 | Discharge: 2018-02-25 | Disposition: A | Discharge status: Home / Self Care | Payer: 59 | Source: Ambulatory Visit | Attending: Acute Care | Admitting: Acute Care

## 2018-02-25 DIAGNOSIS — Z87891 Personal history of nicotine dependence: Secondary | ICD-10-CM | POA: Diagnosis not present

## 2018-02-25 DIAGNOSIS — Z122 Encounter for screening for malignant neoplasm of respiratory organs: Secondary | ICD-10-CM

## 2018-03-04 NOTE — Progress Notes (Signed)
Called spoke with patient, advised of LDCT results / recs as stated by Judson Roch NP.  Pt verbalized understanding and denied any questions.  Results faxed to PCP.  Denise to order LDCT for 02/2019.

## 2018-03-08 ENCOUNTER — Other Ambulatory Visit: Payer: Self-pay | Admitting: Acute Care

## 2018-03-08 DIAGNOSIS — Z87891 Personal history of nicotine dependence: Secondary | ICD-10-CM

## 2018-03-08 DIAGNOSIS — Z122 Encounter for screening for malignant neoplasm of respiratory organs: Secondary | ICD-10-CM

## 2019-03-15 ENCOUNTER — Ambulatory Visit: Payer: 59

## 2020-02-27 IMAGING — CT CT CHEST LUNG CANCER SCREENING LOW DOSE W/O CM
1 of 4 series · 10 of 40 positions shown, 13 images · non-contrast
Comparison: Low-dose lung cancer screening chest CT 02/16/2017.

CLINICAL DATA: 57-year-old female former smoker (quit in June 2011) with 51 pack-year history of smoking. Lung cancer screening
examination.

EXAM:
CT CHEST WITHOUT CONTRAST LOW-DOSE FOR LUNG CANCER SCREENING
TECHNIQUE: Multidetector CT imaging of the chest was performed following the
standard protocol without IV contrast.

[ct lung segmentation data · axial · 0.69mm/px · z∈[-375,-375]mm · 10 of 356 frames shown]
[frame 1/356  mediastinal]
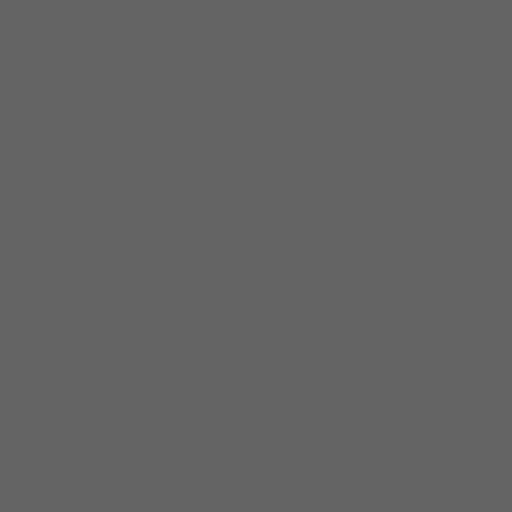
[frame 1/356  lung]
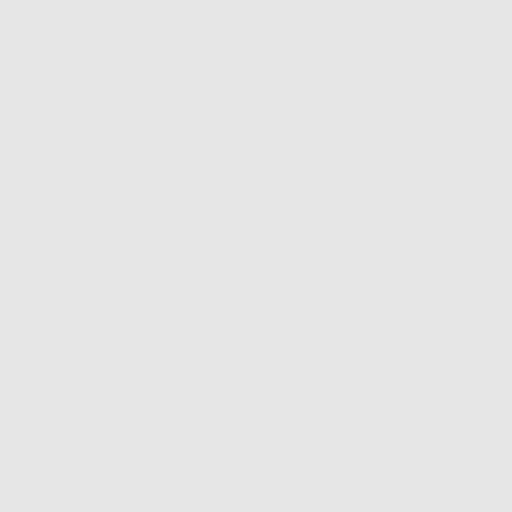
[frame 40/356  lung]
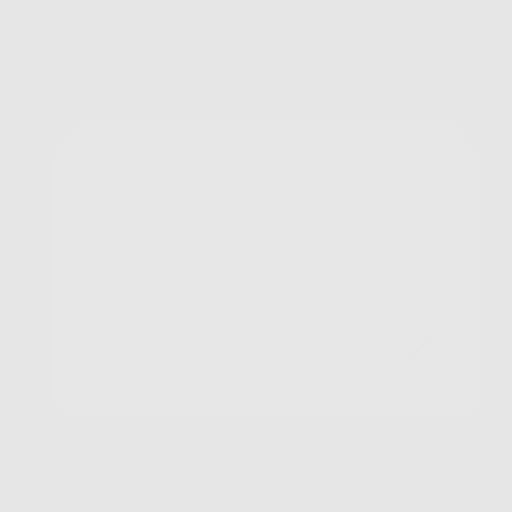
[frame 79/356  lung]
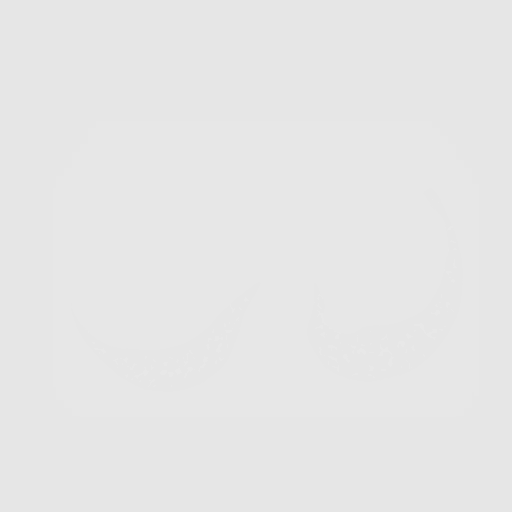
[frame 119/356  lung]
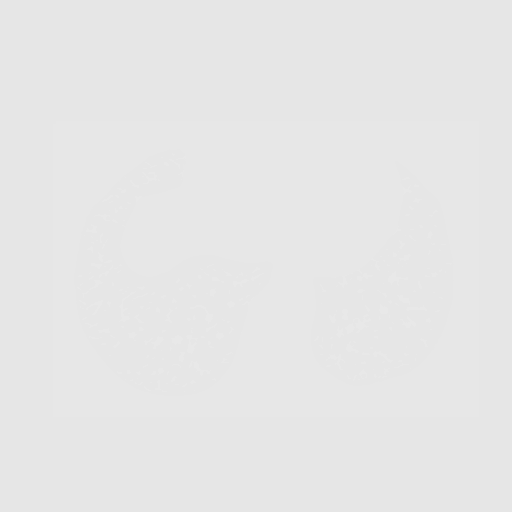
[frame 158/356  mediastinal]
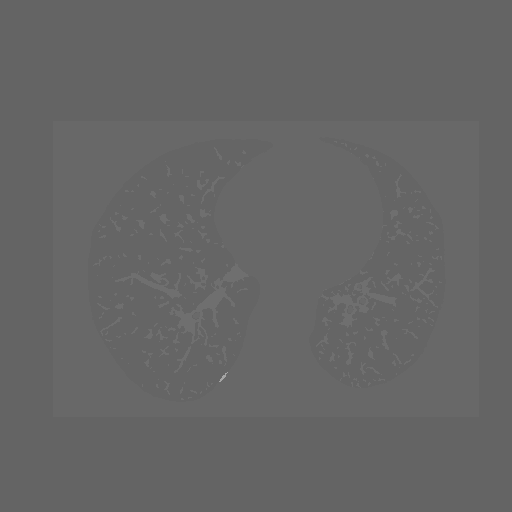
[frame 158/356  lung]
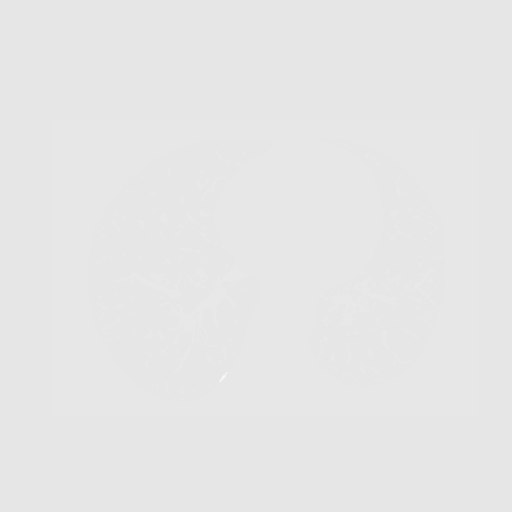
[frame 198/356  lung]
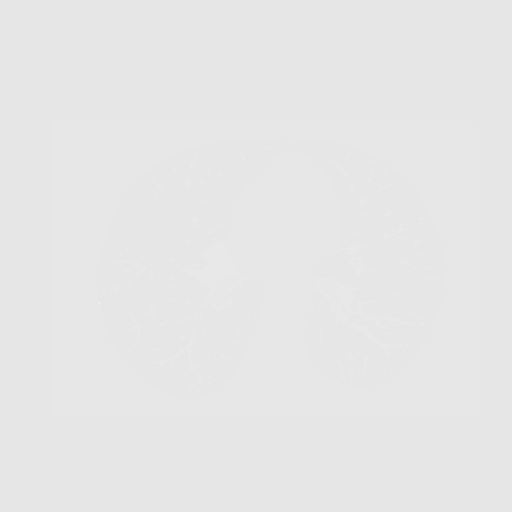
[frame 237/356  lung]
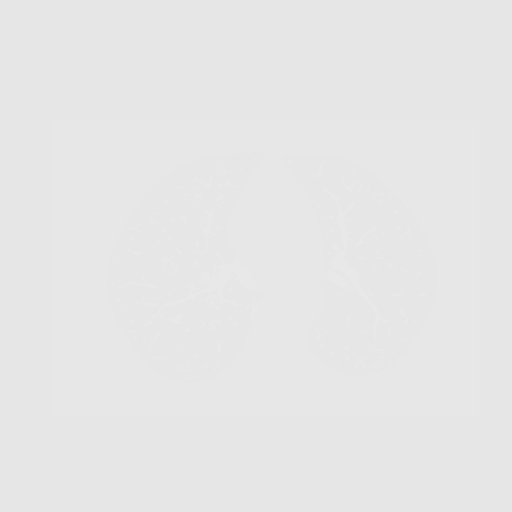
[frame 277/356  lung]
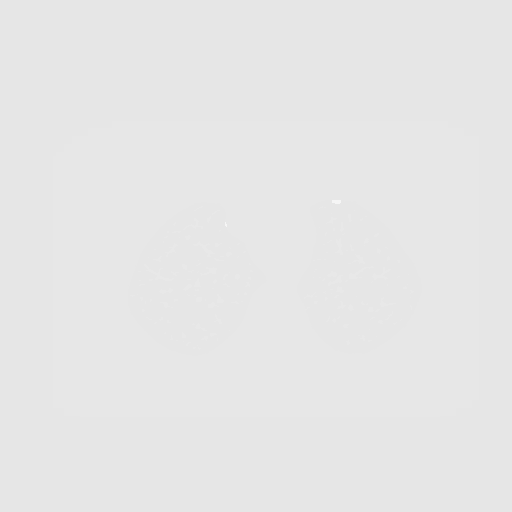
[frame 316/356  mediastinal]
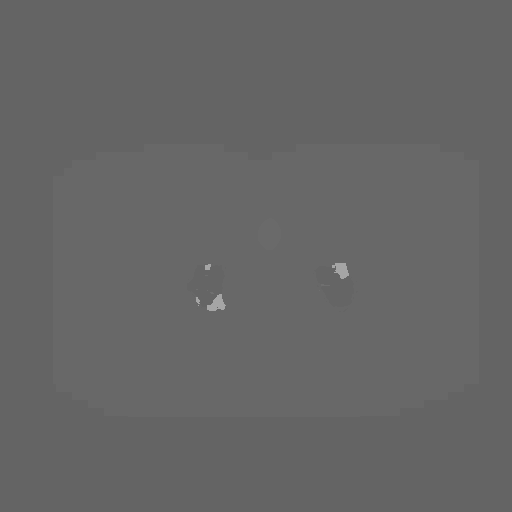
[frame 316/356  lung]
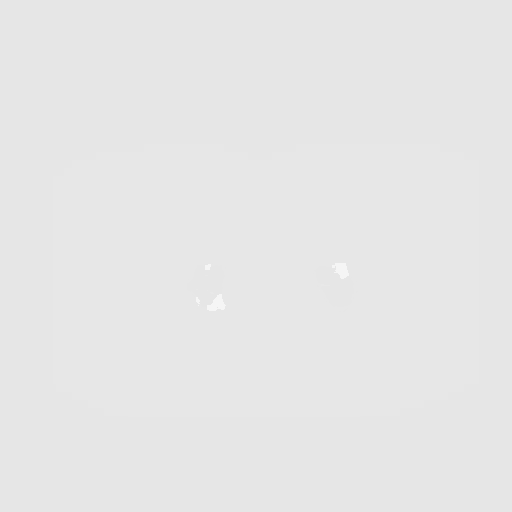
[frame 356/356  lung]
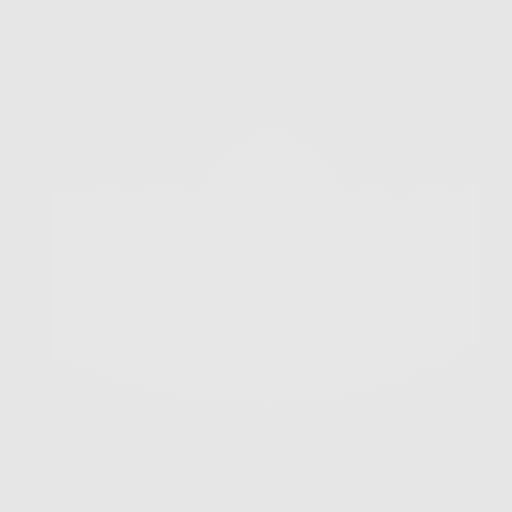

[10 of 40 positions shown; findings below may reference images not displayed]

FINDINGS: Cardiovascular: Heart size is normal. There is no significant
pericardial fluid, thickening or pericardial calcification. There is
aortic atherosclerosis, as well as atherosclerosis of the great
vessels of the mediastinum and the coronary arteries, including
calcified atherosclerotic plaque in the left anterior descending and
left circumflex coronary arteries. Calcifications of the aortic
valve (mild).

Mediastinum/Nodes: No pathologically enlarged mediastinal or hilar
lymph nodes. Please note that accurate exclusion of hilar adenopathy
is limited on noncontrast CT scans. Esophagus is unremarkable in
appearance. No axillary lymphadenopathy.

Lungs/Pleura: Multiple pulmonary nodules in the lungs, the largest
of which is a ground-glass attenuation lesion in the right middle
lobe (axial image 186 of series 3), with a volume derived mean
diameter of 16.2 mm. No larger more suspicious appearing pulmonary
nodules or masses are noted. Diffuse bronchial wall thickening with
mild centrilobular and paraseptal emphysema.

Upper Abdomen: Aortic atherosclerosis.

Musculoskeletal: There are no aggressive appearing lytic or blastic
lesions noted in the visualized portions of the skeleton.
IMPRESSION: 1. Lung-RADS 2S, benign appearance or behavior. Continue annual
screening with low-dose chest CT without contrast in 12 months.
2. The "S" modifier above refers to potentially clinically
significant non lung cancer related findings. Specifically, there is
aortic atherosclerosis, in addition to 2 vessel coronary artery
disease. Please note that although the presence of coronary artery
calcium documents the presence of coronary artery disease, the
severity of this disease and any potential stenosis cannot be
assessed on this non-gated CT examination. Assessment for potential
risk factor modification, dietary therapy or pharmacologic therapy
may be warranted, if clinically indicated.
3. Mild diffuse bronchial wall thickening with mild centrilobular
and paraseptal emphysema; imaging findings suggestive of underlying
COPD.
4. There are calcifications of the aortic valve. Echocardiographic
correlation for evaluation of potential valvular dysfunction may be
warranted if clinically indicated.

Aortic Atherosclerosis (NA6T8-A8A.A) and Emphysema (NA6T8-T04.1).

## 2021-08-27 LAB — COLOGUARD: COLOGUARD: NEGATIVE
